# Patient Record
Sex: Female | Born: 1935
Health system: Southern US, Community
[De-identification: ages and names within clinical notes are randomized; demographics above are authoritative.]

## PROBLEM LIST (undated history)

## (undated) DIAGNOSIS — M4800 Spinal stenosis, site unspecified: Secondary | ICD-10-CM

## (undated) DIAGNOSIS — I10 Essential (primary) hypertension: Secondary | ICD-10-CM

---

## 1998-10-14 ENCOUNTER — Emergency Department (HOSPITAL_COMMUNITY): Admission: EM | Admit: 1998-10-14 | Discharge: 1998-10-14 | Payer: Self-pay | Admitting: Emergency Medicine

## 1998-10-14 ENCOUNTER — Encounter: Payer: Self-pay | Admitting: Emergency Medicine

## 1999-06-20 ENCOUNTER — Encounter: Payer: Self-pay | Admitting: Family Medicine

## 1999-06-20 ENCOUNTER — Encounter: Admission: RE | Admit: 1999-06-20 | Discharge: 1999-06-20 | Payer: Self-pay | Admitting: Family Medicine

## 2000-07-26 ENCOUNTER — Emergency Department (HOSPITAL_COMMUNITY): Admission: EM | Admit: 2000-07-26 | Discharge: 2000-07-26 | Payer: Self-pay

## 2000-07-29 ENCOUNTER — Ambulatory Visit (HOSPITAL_COMMUNITY): Admission: RE | Admit: 2000-07-29 | Discharge: 2000-07-29 | Payer: Self-pay

## 2000-10-09 ENCOUNTER — Encounter: Payer: Self-pay | Admitting: Family Medicine

## 2000-10-09 ENCOUNTER — Encounter: Admission: RE | Admit: 2000-10-09 | Discharge: 2000-10-09 | Payer: Self-pay | Admitting: Family Medicine

## 2001-03-11 ENCOUNTER — Encounter: Admission: RE | Admit: 2001-03-11 | Discharge: 2001-03-11 | Payer: Self-pay | Admitting: Obstetrics and Gynecology

## 2001-03-11 ENCOUNTER — Encounter: Payer: Self-pay | Admitting: Obstetrics and Gynecology

## 2002-06-01 ENCOUNTER — Other Ambulatory Visit: Admission: RE | Admit: 2002-06-01 | Discharge: 2002-06-01 | Payer: Self-pay

## 2003-02-07 ENCOUNTER — Encounter: Payer: Self-pay | Admitting: Family Medicine

## 2003-02-07 ENCOUNTER — Encounter: Admission: RE | Admit: 2003-02-07 | Discharge: 2003-02-07 | Payer: Self-pay | Admitting: Family Medicine

## 2003-09-08 ENCOUNTER — Observation Stay (HOSPITAL_COMMUNITY): Admission: RE | Admit: 2003-09-08 | Discharge: 2003-09-09 | Payer: Self-pay | Admitting: Orthopedic Surgery

## 2004-01-26 ENCOUNTER — Observation Stay (HOSPITAL_COMMUNITY): Admission: RE | Admit: 2004-01-26 | Discharge: 2004-01-27 | Payer: Self-pay | Admitting: Orthopedic Surgery

## 2004-02-16 ENCOUNTER — Encounter (INDEPENDENT_AMBULATORY_CARE_PROVIDER_SITE_OTHER): Payer: Self-pay | Admitting: *Deleted

## 2004-02-16 ENCOUNTER — Observation Stay (HOSPITAL_COMMUNITY): Admission: RE | Admit: 2004-02-16 | Discharge: 2004-02-18 | Payer: Self-pay | Admitting: Orthopedic Surgery

## 2004-09-20 ENCOUNTER — Encounter: Admission: RE | Admit: 2004-09-20 | Discharge: 2004-09-20 | Payer: Self-pay | Admitting: Otolaryngology

## 2004-11-21 ENCOUNTER — Ambulatory Visit (HOSPITAL_COMMUNITY): Admission: RE | Admit: 2004-11-21 | Discharge: 2004-11-21 | Payer: Self-pay | Admitting: Gastroenterology

## 2006-04-25 ENCOUNTER — Encounter: Admission: RE | Admit: 2006-04-25 | Discharge: 2006-04-25 | Payer: Self-pay | Admitting: Family Medicine

## 2006-12-13 ENCOUNTER — Emergency Department (HOSPITAL_COMMUNITY): Admission: EM | Admit: 2006-12-13 | Discharge: 2006-12-13 | Payer: Self-pay | Admitting: Emergency Medicine

## 2008-09-06 ENCOUNTER — Encounter: Admission: RE | Admit: 2008-09-06 | Discharge: 2008-09-06 | Payer: Self-pay | Admitting: Family Medicine

## 2009-02-25 ENCOUNTER — Emergency Department (HOSPITAL_COMMUNITY): Admission: EM | Admit: 2009-02-25 | Discharge: 2009-02-25 | Payer: Self-pay | Admitting: Emergency Medicine

## 2010-10-12 ENCOUNTER — Other Ambulatory Visit: Payer: Self-pay | Admitting: Gastroenterology

## 2010-10-13 ENCOUNTER — Inpatient Hospital Stay (HOSPITAL_COMMUNITY)
Admission: EM | Admit: 2010-10-13 | Discharge: 2010-10-17 | DRG: 378 | Disposition: A | Discharge status: Home / Self Care | Payer: Medicare Other | Attending: Internal Medicine | Admitting: Internal Medicine

## 2010-10-13 DIAGNOSIS — I10 Essential (primary) hypertension: Secondary | ICD-10-CM | POA: Diagnosis present

## 2010-10-13 DIAGNOSIS — D62 Acute posthemorrhagic anemia: Secondary | ICD-10-CM | POA: Diagnosis present

## 2010-10-13 DIAGNOSIS — R5383 Other fatigue: Secondary | ICD-10-CM | POA: Diagnosis present

## 2010-10-13 DIAGNOSIS — R5381 Other malaise: Secondary | ICD-10-CM | POA: Diagnosis present

## 2010-10-13 DIAGNOSIS — K5730 Diverticulosis of large intestine without perforation or abscess without bleeding: Secondary | ICD-10-CM | POA: Diagnosis present

## 2010-10-13 DIAGNOSIS — K5521 Angiodysplasia of colon with hemorrhage: Principal | ICD-10-CM | POA: Diagnosis present

## 2010-10-13 LAB — CROSSMATCH: ABO/RH(D): O NEG

## 2010-10-13 LAB — COMPREHENSIVE METABOLIC PANEL
ALT: 16 U/L (ref 0–35)
AST: 22 U/L (ref 0–37)
Albumin: 3.1 g/dL — ABNORMAL LOW (ref 3.5–5.2)
Alkaline Phosphatase: 36 U/L — ABNORMAL LOW (ref 39–117)
Potassium: 3.8 mEq/L (ref 3.5–5.1)
Sodium: 140 mEq/L (ref 135–145)
Total Protein: 5.8 g/dL — ABNORMAL LOW (ref 6.0–8.3)

## 2010-10-13 LAB — DIFFERENTIAL
Basophils Absolute: 0 10*3/uL (ref 0.0–0.1)
Lymphs Abs: 2.2 10*3/uL (ref 0.7–4.0)
Monocytes Relative: 5 % (ref 3–12)
Neutrophils Relative %: 67 % (ref 43–77)

## 2010-10-13 LAB — CBC
HCT: 19.5 % — ABNORMAL LOW (ref 36.0–46.0)
Hemoglobin: 6.3 g/dL — CL (ref 12.0–15.0)
MCH: 29.2 pg (ref 26.0–34.0)
MCHC: 32.3 g/dL (ref 30.0–36.0)
MCV: 90.3 fL (ref 78.0–100.0)

## 2010-10-13 LAB — OCCULT BLOOD, POC DEVICE: Fecal Occult Bld: POSITIVE

## 2010-10-13 LAB — ABO/RH: ABO/RH(D): O NEG

## 2010-10-14 LAB — BASIC METABOLIC PANEL
BUN: 16 mg/dL (ref 6–23)
CO2: 26 mEq/L (ref 19–32)
Calcium: 8.2 mg/dL — ABNORMAL LOW (ref 8.4–10.5)
Creatinine, Ser: 0.57 mg/dL (ref 0.4–1.2)
GFR calc non Af Amer: >60 mL/min (ref >60)
Glucose, Bld: 107 mg/dL — ABNORMAL HIGH (ref 70–99)
Sodium: 141 mEq/L (ref 135–145)

## 2010-10-14 LAB — HEMOGLOBIN AND HEMATOCRIT, BLOOD
HCT: 27.1 % — ABNORMAL LOW (ref 36.0–46.0)
HCT: 22.3 % — ABNORMAL LOW (ref 36.0–46.0)
HCT: 23.4 % — ABNORMAL LOW (ref 36.0–46.0)
Hemoglobin: 7.8 g/dL — ABNORMAL LOW (ref 12.0–15.0)

## 2010-10-15 LAB — BASIC METABOLIC PANEL
CO2: 25 mEq/L (ref 19–32)
Chloride: 111 mEq/L (ref 96–112)
Glucose, Bld: 107 mg/dL — ABNORMAL HIGH (ref 70–99)
Sodium: 140 mEq/L (ref 135–145)

## 2010-10-15 LAB — CBC
Hemoglobin: 7.8 g/dL — ABNORMAL LOW (ref 12.0–15.0)
MCH: 29.3 pg (ref 26.0–34.0)
MCHC: 32.8 g/dL (ref 30.0–36.0)
MCV: 89.5 fL (ref 78.0–100.0)
RBC: 2.66 MIL/uL — ABNORMAL LOW (ref 3.87–5.11)

## 2010-10-15 LAB — PROTIME-INR: Prothrombin Time: 14.7 seconds (ref 11.6–15.2)

## 2010-10-15 LAB — APTT: aPTT: 27 seconds (ref 24–37)

## 2010-10-15 LAB — HEMOGLOBIN AND HEMATOCRIT, BLOOD
HCT: 21.3 % — ABNORMAL LOW (ref 36.0–46.0)
HCT: 22.8 % — ABNORMAL LOW (ref 36.0–46.0)
Hemoglobin: 6.9 g/dL — CL (ref 12.0–15.0)

## 2010-10-15 LAB — PHOSPHORUS: Phosphorus: 3 mg/dL (ref 2.3–4.6)

## 2010-10-15 LAB — MAGNESIUM: Magnesium: 2.3 mg/dL (ref 1.5–2.5)

## 2010-10-15 NOTE — H&P (Signed)
[name], [name]               ACCOUNT NO.:  [id number]  MEDICAL RECORD NO.:  [id number]           PATIENT TYPE:  I  LOCATION:  1408                         FACILITY:  [facility]  PHYSICIAN:  Gordy Savers, MDDATE OF BIRTH:  1921-11-07  DATE OF ADMISSION:  10/13/2010 DATE OF DISCHARGE:                             HISTORY & PHYSICAL   CHIEF COMPLAINT:  Weakness.  HISTORY OF PRESENT ILLNESS:  The patient is an 77 year old white female who states that she has a remote history of peptic ulcer disease greater than 20 years ago.  At that time, she presented with pain but apparently no bleeding.  Five days ago, she had the onset of black melanotic stool. Three days ago, she was seen by her primary care provider and she was noted to be anemic at that time.  Apparently her H and H was normal 3 weeks prior at the time of her annual physical.  Yesterday, she underwent upper panendoscopy that apparently revealed no source of bleeding.  She was told to report to the emergency department if there was any clinical deterioration.  Over the past few days, she has had increasing weakness, dizziness, and especially palpitations.  Symptoms are worsened with activity.  Evaluation in the emergency department included a CBC that revealed a hemoglobin of 6.3 gm/dL and a hematocrit of 54.0%.  The patient is now admitted for further evaluation and treatment of her GI bleeding and mildly severe anemia.  PAST MEDICAL HISTORY:  Pertinent for a history of hypertension and osteoarthritis.  She has had a prior appendectomy and remote hysterectomy.  She underwent a colonoscopy in May of 2006 that revealed diverticular disease only.  She had 3 shoulder surgeries in 2005 for adhesive capsulitis complicated by infection.  FAMILY HISTORY:  Fairly noncontributory.  Father died at 59.  Mother died at 70 of natural causes.  She has had two brothers and three sisters, one brother died young in a motor vehicle  accident.  One sister died of diabetic complications.  SOCIAL HISTORY:  She was married for 57 years and is widowed.  She has 2 sons and 2 daughters.  One daughter died of MI at age 83.  She is a lifelong nondrinker, nonsmoker.  She lives independently and is quite active.  She states that she mowed her lawn 4 days ago.  DRUG ALLERGIES:  Include CODEINE.  MEDICATIONS:  Medical regimen:  Metoprolol 50 mg daily, oxybutynin 5 mg daily p.r.n., Advil she states she usually takes 2 every morning but she has held this medication for the past 2 days.  She takes multivitamins and calcium supplements.  REVIEW OF SYSTEMS:  Positive for progressive weakness, dizziness and palpitations.  No change in weight, although she states that she feels there has been increasing abdominal girth.  HEENT EXAM:  No visual disturbances or hearing loss.  PULMONARY:  Some dyspnea on exertion.  No history of pneumonia or pulmonary disease.  No history of tobacco use. CARDIAC:  No history of exertional chest pain.  No history of heart murmur.  GI:  See history of present illness.  GENITOURINARY:  Denies any  urinary frequency or dysuria.  She states that she takes oxybutynin for elevated blood pressure.  She is unaware of a diagnosis of overactive bladder.  ENDOCRINE:  No history of polyuria, polydipsia or thyroid disorder.  NEUROPSYCH:  No history of depression or any focal neurological symptoms.  PHYSICAL EXAMINATION:  VITAL SIGNS:  Temperature 98.2, pulse rate 81, respiratory rate 16, blood pressure 127/72, oxygen saturation 96%. GENERAL EXAM:  Revealed a well-developed healthy-appearing white female who appeared younger than her stated age.  She was alert and in no distress. SKIN:  Unremarkable except for paleness.  She did have a clean superficial abrasion over her left lateral lower leg. HEENT EXAM:  Revealed normal pupil responses.  Conjunctiva pale.  ENT otherwise unremarkable. NECK:  Supple.  No bruits  or neck vein distention. CHEST:  Clear. CARDIOVASCULAR EXAM:  Revealed a grade 2/6 systolic murmur loudest at the primary aortic area but heard diffusely. BREASTS:  Negative.  There was no resting tachycardia with the patient supine. ABDOMEN:  Obese, soft and nontender.  No organomegaly.  Right lower quadrant surgical scar noted. EXTREMITIES:  Revealed full pedal pulses.  No significant edema. NEURO EXAM:  Revealed the patient to be alert and oriented with normal affect.  Exam was nonfocal.  There was no motor weakness.  LABORATORY STUDIES:  Included an upper panendoscopy on October 12, 2010, that revealed no obvious bleeding site.  Hemoglobin and hematocrit of 6.3 gm/dL and hematocrit 16.1%.  MCV 90.  Chemistries revealed a glucose of 141, INR 1.0.  IMPRESSION: 1. Gastrointestinal bleeding. 2. Anemia.  ADDITIONAL DIAGNOSES: 1. History of hypertension. 2. Osteoarthritis. 3. Diverticulosis.  DISPOSITION:  The patient will be admitted to a telemetry setting. Blood has been typed and crossed and she will be transfused 2 units when available.  She has been given 500 cc of normal saline.  Her blood pressure and hemodynamic status is presently stable.  Serial H and H will be monitored carefully.  The patient will be considered for a colonoscopy Monday morning.  She will be placed on a clear liquid diet. She will be placed on empiric Protonix 40 mg IV.  Her ibuprofen will be discontinued.  She will be treated with sequential compression devices for DVT prophylaxis.     Gordy Savers, MD     PFK/MEDQ  D:  10/13/2010  T:  10/14/2010  Job:  [id number]  Electronically Signed by Eleonore Chiquito MD on 10/15/2010 10:03:52 AM

## 2010-10-16 LAB — CBC
HCT: 27.3 % — ABNORMAL LOW (ref 36.0–46.0)
Hemoglobin: 8.9 g/dL — ABNORMAL LOW (ref 12.0–15.0)
MCHC: 32.6 g/dL (ref 30.0–36.0)
RBC: 3.05 MIL/uL — ABNORMAL LOW (ref 3.87–5.11)

## 2010-10-16 LAB — BASIC METABOLIC PANEL
CO2: 28 mEq/L (ref 19–32)
Calcium: 8.1 mg/dL — ABNORMAL LOW (ref 8.4–10.5)
Chloride: 108 mEq/L (ref 96–112)
Glucose, Bld: 99 mg/dL (ref 70–99)
Sodium: 139 mEq/L (ref 135–145)

## 2010-10-17 LAB — TYPE AND SCREEN
Unit division: 0
Unit division: 0
Unit division: 0
Unit division: 0

## 2010-10-17 LAB — BASIC METABOLIC PANEL
BUN: 6 mg/dL (ref 6–23)
CO2: 27 mEq/L (ref 19–32)
Chloride: 108 mEq/L (ref 96–112)
Glucose, Bld: 111 mg/dL — ABNORMAL HIGH (ref 70–99)
Potassium: 3.7 mEq/L (ref 3.5–5.1)
Sodium: 139 mEq/L (ref 135–145)

## 2010-10-17 LAB — CBC
HCT: 28.6 % — ABNORMAL LOW (ref 36.0–46.0)
Hemoglobin: 9.3 g/dL — ABNORMAL LOW (ref 12.0–15.0)
MCV: 90.5 fL (ref 78.0–100.0)
RBC: 3.16 MIL/uL — ABNORMAL LOW (ref 3.87–5.11)
RDW: 15.4 % (ref 11.5–15.5)
WBC: 5.6 10*3/uL (ref 4.0–10.5)

## 2010-10-22 NOTE — Discharge Summary (Signed)
  NAMESHERECE, GAMBRILL               ACCOUNT NO.:  [id number]  MEDICAL RECORD NO.:  [id number]           PATIENT TYPE:  I  LOCATION:  1408                         FACILITY:  [facility]  PHYSICIAN:  Hollice Espy, M.D.DATE OF BIRTH:  04-01-1922  DATE OF ADMISSION:  10/13/2010 DATE OF DISCHARGE:  10/17/2010                              DISCHARGE SUMMARY   PRIMARY CARE PHYSICIAN:  L. Lupe Carney, M.D.  CONSULTING NURSE:  Graylin Shiver, M.D., [facility].  DISCHARGE DIAGNOSES: 1. Lower gastrointestinal tract bleed, thought to be secondary to     bleeding arteriovenous malformation. 2. 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 3. Acute blood loss anemia secondary to bleeding arteriovenous     malformation. 4. History of hypotension. 5. History of osteoarthritis. 6. History of incidental diverticulosis.  Discharge medications for this patient are as follows: 1. The patient normally is on Advil 400 p.o. daily.  She will not     restart this until she sees Dr. Clovis Riley.  She is being discharged     on artificial tears one drop both eyes as needed. 2. Os-Cal with D 1 tablet p.o. daily. 3. Coenzyme Q 60 mg p.o. daily. 4. Vitamin B12, 1000 mcg p.o. daily. 5. Fish oil 1000 mg p.o. daily. 6. Metoprolol 50 mg p.o. daily.  HOSPITAL COURSE:  The patient is an 77 year old white female with a past medical history of hypertension and osteoarthritis, who presented on September 15, 2010, because of fatigue, weakness, palpations, found to have a hemoglobin of 6.3.  She had previously seen Dr. Dulce Sellar 1 week prior for a history of melenic stools and anemia.  He had done an EGD, which was unremarkable.  __________  colonoscopy, however, because of her symptoms she came to the emergency room.  She was noted to have dark black stools, now for a few weeks intermittently.  She was admitted by the hospitalist.  She was given several units of packed red blood cells. [facility] was consulted, performed a colonoscopy and she was found to have several small AVMs in the cecum, which were cauterized with an argon plasma coagulator.  She also noted to have some incidental diverticulosis.  Postprocedure, she was stable.  Her hemoglobin on the morning at 05:27, was noted to be 8.9, by evening was 9.5 and by morning of discharge is 9.3.  __________ Her blood pressure has been stable at 119/64 with a heart rate 61 after her beta-blocker.  She is otherwise looking well.  She is tolerating p.o.  Our plan will be to discharge the patient to home.  She will follow up with Dr. Clovis Riley in the next 2 weeks.  DISPOSITION:  Improved.  ACTIVITY:  Slowly increase.  DISCHARGE DIET:  Low-sodium diet.     Hollice Espy, M.D.     SKK/MEDQ  D:  10/17/2010  T:  10/18/2010  Job:  [id number]  cc:   Graylin Shiver, M.D. Fax: [phone]  L. Lupe Carney, M.D. Fax: [phone]  Electronically Signed by Virginia Rochester M.D. on 10/22/2010 02:37:30 PM

## 2010-10-23 NOTE — Consult Note (Signed)
  NAMETAHJ, LINDSETH NO.:  [id number]  MEDICAL RECORD NO.:  [id number]           PATIENT TYPE:  I  LOCATION:  1408                         FACILITY:  [facility]  PHYSICIAN:  Graylin Shiver, M.D.   DATE OF BIRTH:  1922-03-02  DATE OF CONSULTATION:  10/14/2010 DATE OF DISCHARGE:                                CONSULTATION   REASON FOR CONSULTATION:  The patient is an 77 year old female who came to the emergency room yesterday at [facility] because of complaints of fatigue, weakness, and heart palpitations.  She was found to have a hemoglobin of 6.3 and a hematocrit of 19.5.  The patient had recently seen Dr. Dulce Sellar last week because of a history of melanic stools and anemia and an EGD done last week was unrevealing for a source of this problem.  He was planning to do a colonoscopy on Monday as an outpatient; however, because of her symptoms, she came to the emergency room and was admitted.  The patient states that she has seen dark black- colored stools for a few weeks now intermittently.  PAST MEDICAL HISTORY: 1. Hypertension. 2. Osteoarthritis. 3. History of diverticulosis, last colonoscopy was in 2006.  PAST SURGICAL HISTORY:  Appendectomy, hysterectomy, and various orthopedic surgeries.  FAMILY HISTORY:  Positive for colon cancer in her father.  ALLERGIES: 1. CODEINE. 2. AMLODIPINE BESYLATE.  PHYSICAL EXAMINATION:  GENERAL:  She does not appear in any acute distress at this time, nonicteric. HEART:  Regular rhythm.  No murmurs. LUNGS:  Clear. ABDOMEN:  Soft.  Nontender.  No hepatosplenomegaly.  IMPRESSION: 1. Anemia. 2. Melena with heme-positive stool, documented here in the hospital.  PLAN:  We will plan to proceed with colonoscopy tomorrow to further investigate.          ______________________________ Graylin Shiver, M.D.     SFG/MEDQ  D:  10/14/2010  T:  10/14/2010  Job:  [id number]  cc:   Willis Modena, MD Fax:  [phone]  Llana Aliment. Malon Kindle., M.D. Fax: [phone]  Electronically Signed by Herbert Moors MD on 10/23/2010 04:38:38 PM

## 2010-10-23 NOTE — Op Note (Signed)
  [name], [name]               ACCOUNT NO.:  [id number]  MEDICAL RECORD NO.:  [id number]           PATIENT TYPE:  I  LOCATION:  1408                         FACILITY:  [facility]  PHYSICIAN:  Graylin Shiver, M.D.   DATE OF BIRTH:  1922-02-11  DATE OF PROCEDURE:  10/15/2010 DATE OF DISCHARGE:                              OPERATIVE REPORT   PROCEDURE:  Colonoscopy with argon plasma coagulation.  INDICATION FOR PROCEDURE:  Ongoing melena despite normal esophagogastroduodenoscopy and associated heme-positive stool and anemia.  Informed consent was obtained after explanation of the risks of bleeding, infection, and perforation.  PREMEDICATIONS:  Fentanyl 60 mcg IV, Versed 6 mg IV.  PROCEDURE IN DETAIL:  With the patient in the left lateral decubitus position, a rectal exam was performed and no masses were felt.  The Pentax colonoscope was inserted into the rectum and advanced around the colon to the cecum.  Cecal landmarks were identified.  In the cecum, there were several small-to-medium-sized arteriovenous malformations.  I washed these areas and one of these areas did seem to ooze a little bit with washing.  It was felt that in view of her ongoing melena and negative EGD and significant anemia, that these were probably the source for her problem.  It was therefore indicated to proceed with argon plasma coagulation.  These several AVMs were coagulated with the argon plasma coagulator with good results.  There was some oozing noted after a couple of bursts of coagulation to a couple of these areas that we stopped with further cautery and washing and watching the area.  The rest of the colon looked normal except for some diverticulosis in the left colon.  She tolerated the procedure well without complications.  IMPRESSION:  Several small arteriovenous malformations in the cecum, which were cauterized with the argon plasma coagulator and diverticulosis of the left colon.  PLAN:   Observe response to coagulation.  This may need to be done again should bleeding persists.          ______________________________ Graylin Shiver, M.D.     SFG/MEDQ  D:  10/15/2010  T:  10/16/2010  Job:  [id number]  cc:   Willis Modena, MD Fax: [phone]  Electronically Signed by Herbert Moors MD on 10/23/2010 04:38:40 PM

## 2010-12-07 NOTE — Op Note (Signed)
[name], [name]                         ACCOUNT NO.:  [id number]   MEDICAL RECORD NO.:  [id number]                   PATIENT TYPE:  AMB   LOCATION:  DAY                                  [facility]ACILITY:  [facility]   PHYSICIAN:  Vania Rea. Supple, M.D.               DATE OF BIRTH:  08-Jan-1922   DATE OF PROCEDURE:  01/26/2004  DATE OF DISCHARGE:                                 OPERATIVE REPORT   PREOPERATIVE DIAGNOSIS:  Recurrent right shoulder rotator cuff tear.   POSTOPERATIVE DIAGNOSIS:  1. Recurrent right shoulder rotator cuff tear.  2. Right shoulder adhesive capsulitis.   PROCEDURES:  1. Repair of recurrent right shoulder rotator cuff tear with restore patch     augmentation.  2. Right shoulder manipulation under anesthesia.   SURGEON OF RECORD:  Vania Rea. Supple, M.D.   ASSISTANT:  French Ana Shuford, P.A.C.   ANESTHESIA:  General endotracheal as well as __________ interscalene block.   ESTIMATED BLOOD LOSS:  250 mL.   DRAINS:  None.   BRIEF HISTORY:  [name] is an 77 year old female who underwent a previous  right shoulder rotator cuff repair for a large retracted rotator cuff tear  earlier this year.  She had initially done very well but then had an abrupt  increase in right shoulder pain following physical therapy session.  Her  examination has subsequently showed persistent weakness and limitation of  motion with severe pain.  Subsequent MRI scan confirms a large, recurrent  tear of the rotator cuff and also evidence for fatty infiltration of the  supra and infraspinatus.  At this point considering [name]' significant  pain and functional limitations, she is brought to the operating room at  this time for an attempt at repeat repair of the rotator cuff.   Preoperatively discussed with [name] the treatment options as well as  risks vs. benefits thereof.  The possible surgical complications of  bleeding, infection, neurovascular injury, persistent pain, loss of  motion,  recurrence of rotator cuff tear, anesthetic complication and possible need  for additional surgery are reviewed.  She understands and accepts and agrees  with our planned procedure.   PROCEDURE IN DETAIL:  After undergoing routine preoperative evaluation, the  patient received prophylactic antibiotics and had an interscalene block  established in the holding area with anesthesia, no problem.  Placed supine  on the operating table and underwent the induction of general endotracheal  anesthesia.  Placed into the beach chair position with the right shoulder  region serially prepped and draped in standard fashion.  Through the  previous right shoulder incision we reopened the incision a total length of  approximately 8 cm beginning at the acromioclavicular interval extending  laterally and distally.  Skin flaps were mobilized and electrocautery was  used for hemostasis.  The previous split in the deltoid was then reopened to  the junction between the anterior and mid thirds.  Previous distal clavicle  resection and subacromial decompression allowed proper visualization.  There  were noted to be multiple adhesions in the subdeltoid subacromial bursa.  In  addition, there was obvious complete retracted tear of the rotator cuff,  with the subscapularis and supraspinatus having been retracted medially and  anteriorly and the entire humeral head being exposed and abutting the  undersurface of the acromion.  The infraspinatus was not visualized and  appeared to have been completely degenerated.  The upper margin on the teres  minor was visualized.  We performed an extensive bursectomy and lysis of  adhesions and freed up the rotator cuff superiorly and inferiorly over the  glenoid to allow reasonable elasticity of the cuff tissues.  The biceps  tendon was intact and this was preserved.  The glenohumeral joint was  inspected and irrigated of debris.  The rotator cuff margins were then   mobilized to allow the supraspinatus and subscapularis to be brought  superiorly and posteriorly to allow a side-to-side repair of the interval  between the teres minor and the anterior cuff tissues.  A series of figure-  of-eight #2 5-0 wire sutures were placed.  As the tissues were being  tensioned, it was clear that the shoulder was quite stiff and at this point  we proceeded with a manipulation and this allowed release of multiple  adhesions and contractures in the axillary pouch and once this was completed  then the humeral head sat symmetrically within the glenoid and was no longer  high riding.  Once this had been completed, then the rotator cuff interval  nicely reapproximated.  Two separate Arthrex Bio-Cork screw suture anchors  were then placed in the bony trough on the greater tuberosity.  The bony  trough was redebrided with a rongeur to a bony bed.  The previously placed  drill holes through the greater tuberosity appeared to be intact but there  were multiple sutures visible in the anterior margin rotator cuff.  It is  unclear whether the retained sutures that we identified were related to  disruption of the side-to-side repair, which we had previously performed, or  if the sutures had pulled out through the bone on the greater tuberosity.  Regardless, the retained sutures were all removed.  The suture anchors were  then placed, one was a 5.0 and the second being a 6.5.  Good bony purchase  was achieved.  The free limbs of the suture were then placed through the  adjacent aspect of the anterior rotator cuff leaflet and then a separate set  of limbs was passed through the teres minor posteriorly.  These sutures were  all then sequentially tied after final irrigation had been performed.  This  allowed good reapproximation of the anterior and posterior aspects of the  rotator cuff and then good reapproximation of the cuff tissue over the bony bed on the greater tuberosity.  There  was a residual small deficiency  rotator cuff tissue overlying the most central aspect of the repair site at  the greater tuberosity.  With this in mind, we opened up a restore patch,  moistened it.  When it was appropriately rehydrated, the restore patch was  then sutured into position over the rotator cuff repair using #2 Vicryl  suture.  Once the restore patch was sutured into position with proper  tension, the margins were then trimmed.  The shoulder was taken through a  range of motion and there was no evidence for excessive tension with  the arm  at the side.  Final irrigation and hemostasis was performed.  The deltoid  split was closed with a series of figure-of-eight #1 Ethibond sutures.  The  2-0 Vicryl used for the subcu and intracuticular 3-0 Monocryl for the skin  followed by Steri-Strips.  A bulky pressure dressing was applied.  A DonJoy  UltraSling was then applied with a small abduction pillow.  The patient was  then placed supine, extubated and taken to the recovery room in stable  condition.                                               Vania Rea. Supple, M.D.    KMS/MEDQ  D:  01/26/2004  T:  01/26/2004  Job:  [id number]

## 2010-12-07 NOTE — Op Note (Signed)
Jocelyn [name], [name]                         ACCOUNT NO.:  [id number]   MEDICAL RECORD NO.:  [id number]                   PATIENT TYPE:  AMB   LOCATION:  DAY                                  FACILITY:  [facility]   PHYSICIAN:  Vania Rea. Supple, M.D.               DATE OF BIRTH:  02-19-1922   DATE OF PROCEDURE:  09/08/2003  DATE OF DISCHARGE:                                 OPERATIVE REPORT   PREOPERATIVE DIAGNOSES:  1. Right shoulder rotator cuff tear.  2. Right shoulder chronic impingement syndrome.  3. Right shoulder acromioclavicular joint arthrosis.   POSTOPERATIVE DIAGNOSES:  1. Right shoulder rotator cuff tear.  2. Right shoulder chronic impingement syndrome.  3. Right shoulder acromioclavicular joint arthrosis.   OPERATION/PROCEDURE:  1. Open right shoulder rotator cuff repair.  2. Right shoulder subacromial depression.  3. Right shoulder distal resection.   SURGEON:  Vania Rea. Supple, M.D.   Threasa HeadsFrench Ana A. Shuford, P.A.-C.   ANESTHESIA:  LMA, general.   ESTIMATED BLOOD LOSS:  200 mL.   HISTORY:  Jocelyn [name] is an 77 year old female who has had chronic right  shoulder pain with limitations in motion with examination and showing  weakness to testing rotator cuff and severely positive impingement sign.  Plain radiographs confirm AC joint arthrosis with preoperative MRI scan  confirming a large retracted tear of the rotator cuff.  Due to ongoing pain  and functional limitations, she is brought to the operating room for a  planned right shoulder rotator repair as described below.   Preoperatively, Jocelyn [name] was counseled  options as well as risks versus  benefits.  Possible surgical complications of bleeding, infection,  neurovascular injury, persistent pain, loss of motion, recurrence of rotator  cuff tear, anesthesia complications, possible need for additional surgery  were reviewed.  She understands and accepts and agrees to the planned  procedure.   DESCRIPTION OF PROCEDURE:  After undergoing preoperative evaluation, the  patient was given prophylactic antibiotics.  She had an interscalene block  established in the holding area by the anesthesia department.  She was  placed supine on the operating table and underwent a smooth induction of an  LMA general anesthesia.  She was placed in the beach chair position and  appropriately padded and protected.  The right shoulder region was sterilely  prepped and draped in the standard fashion.   An incision was outlined beginning at the [facility] joint and extending laterally  and distally for a length of approximately 6 cm.  The proposed incision was  infiltrated with 8 mL of 0.5% Marcaine with epinephrine.  The skin and  subcutaneous tissues were then sharply divided.  The deltoid was then split  along the line of the skin incision with electrocautery.  Subperiosteal  elevation was then used to expose the distal clavicle, the [facility] joint and the  anterior acromion.  The ligament was divided.  Distal clavicle  was then  exposed and the surrounding soft tissues were appropriately protected and an  oscillating saw was then used to perform distal palpable resection removing  appropriately 1 cm of the distal clavicle.  The anterior acromion was then  appropriately exposed.  Using the oscillating saw, we performed an anterior  inferior acromionectomy with _________ morphology.  We performed an  extensive subacromial bursectomy, removing large amount of scarified tissue,  as well as bursal tissue, and completely freeing up adhesions between the  undersurface of the deltoid and the adjacent rotator cuff.  We did identify  an obvious large defect and rotator cuff involving the entire supraspinatus  and the anterior portion of the infraspinatus.  The biceps tendon was  identified and found to be in good condition and was carefully protected and  remained intact.  The free margin of the rotator cuff was then  trimmed with  15-blade knife back to healthy-appearing tissue.  The cuff was then  mobilized in both superior and inferior aspects.  The rotator cuff had a L  shaped tear.  We placed two-sided, side reapproximating sutures to close  down the arm of the L tear.  We then placed a series of three modified  Kessler grasping sutures in the remaining free margin rotator cuff.  All of  these were performed with #2 fiber wire sutures.  An osteotome was then used  to create a cancellous _________, measuring approximately 3 cm in width.  Rongeur was then used to smooth the contour, remove residual bony debris and  soft tissue.  At this point the joint was copiously irrigated.  The  glenohumeral articular surfaces being in relatively good condition.  The  Concept tenaculum was then used to make two bone tunnels and suture limbs  were then brought down through the bone tunnels and then tied sequentially  from anterior to posterior, allowing excellent reapposition of the premargin  rotator cuff down to the bony bed on the greater tuberosity.  We then placed  an additional figure-of-eight suture to further reinforce the repair at the  level of the greater tuberosity.  The arm was then taken through a range of  motion showing good stability and repair and no evidence for excessive  tension when the arm is at the side.  Final irrigation was performed.  Hemostasis was obtained.  We used two #1 Ethibond sutures to repair the  anterior margin of the deltoid to the anterior acromion through bone  tunnels.  The remaining deltoid was then closed with interrupted figure-of-  eight #1 Vicryl sutures, 2-0 Vicryl was used for the subcutaneous layer and  intracuticular 3-0 Monocryl used for the skin followed by Steri-Strips.  Bulky dry dressing was placed on the shoulder and the arm was placed in  shoulder immobilizer.  The patient was once again placed supine, extubated and taken to the recovery room in stable  condition.                                               Vania Rea. Supple, M.D.    KMS/MEDQ  D:  09/08/2003  T:  09/08/2003  Job:  [id number]

## 2010-12-07 NOTE — Op Note (Signed)
[name], [name]               ACCOUNT NO.:  [id number]   MEDICAL RECORD NO.:  [id number]          PATIENT TYPE:  AMB   LOCATION:  ENDO                         FACILITY:  [facility]   PHYSICIAN:  James L. Malon Kindle., M.D.DATE OF BIRTH:  1922/03/13   DATE OF PROCEDURE:  11/21/2004  DATE OF DISCHARGE:                                 OPERATIVE REPORT   PROCEDURE:  Colonoscopy.   MEDICATIONS:  Fentanyl 40 mcg, Versed 3.5 mg IV.   SCOPE:  Olympus pediatric adjustable colonoscope.   INDICATIONS:  Screening colonoscopy.   DESCRIPTION OF PROCEDURE:  The procedure was explained to the patient and  consent obtained.  With the patient in the left lateral decubitus position,  the Olympus scope was inserted and advanced.  The prep was quite good.  The  patient had diverticulosis.  We were eventually able to pass the area of  diverticulosis and get over to the right colon using abdominal pressure,  position changes.  With the patient in the right lateral decubitus position,  we were able to advance down to the ascending colon.  I could see the  ileocecal valve and the appendiceal orifice and cecum in the distance but  could not advance completely down into it.  The patient had a very atonic,  large abdomen.  The scope was withdrawn and again, the cecum was seen in the  distance and appeared normal.  No gross lesions.  The ascending colon,  transverse colon, splenic flexure, descending and sigmoid colon were seen  well.  No polyps were seen.  Moderate diverticulosis in the sigmoid colon.  The scope was withdrawn.  The patient tolerated the procedure well, was  maintained on low flow oxygen, pulse oximetry throughout the procedure.   ASSESSMENT:  1.  Normal screening colonoscopy.  V76.51.  2.  Diverticulosis.  562.10.   PLAN:  Would recommend yearly Hemoccult's and repeat again only for specific  indications such as positive stool, etc.      JLE/MEDQ  D:  11/21/2004  T:  11/21/2004  Job:   [id number]   cc:   Foye Clock  [address]  [place]  [place] [address]  Fax: [phone]

## 2010-12-07 NOTE — Op Note (Signed)
[name], [name]                         ACCOUNT NO.:  [id number]   MEDICAL RECORD NO.:  [id number]                   PATIENT TYPE:  INP   LOCATION:  0004                                 FACILITY:  [facility]   PHYSICIAN:  Vania Rea. Supple, M.D.               DATE OF BIRTH:  1922-03-16   DATE OF PROCEDURE:  02/16/2004  DATE OF DISCHARGE:                                 OPERATIVE REPORT   PREOPERATIVE DIAGNOSIS:  Right shoulder postoperative drainage with possible  infection versus sterile inflammatory process related to reaction to the  Restore patch utilized for augmentation of rotator cuff repair.   POSTOPERATIVE DIAGNOSES:  1. Right shoulder postoperative drainage with possible infection versus     sterile inflammatory process related to reaction to the Restore patch     utilized for augmentation of rotator cuff repair.  2. Recurrent right rotator cuff tear.   PROCEDURES:  Right shoulder exploration, irrigation, and debridement.   SURGEON OF RECORD:  Vania Rea. Supple, M.D.   Threasa HeadsFrench Ana A. Shuford, P.A.-C.   ANESTHESIA:  General endotracheal.   ESTIMATED BLOOD LOSS:  150 mL.   SPECIMENS:  Fluid was sent for routine culture and sensitivity as well as a  Gram stain.  Intraoperative Gram stain did come back with moderate wbc's but  no organisms seen.  The tissue from the subacromial space including several  clumps of fatty-appearing tissue as well as rotator cuff tissue were sent  for routine pathology.   DRAINS:  Hemovac x1.   HISTORY:  [name] is an 77 year old female who is three weeks out from a  repair of a recurrent right shoulder rotator cuff tear which had been  augmented with a Restore patch.  She did well initially but on return to the  office this past Monday, three days ago, she had noticed some increased pain  as well as serous drainage from the proximal aspect of her wound.  At that  time the wound had been opened up and approximately 25 mL of serous  fluid  with several large fatty clumps which had the appearance of fat necrosis  were evacuated.  A small wick drain was placed and we started her on oral  Keflex.  Subsequent evaluation in the office showed that the wound had shown  further deterioration with some early erythema as well as persistent  drainage and increased pain.  She is subsequently brought to the operating  room today for planned formal I&D.   Preoperatively discussed with [name] treatment options as well as risks  versus benefits thereof.  Possible complications of bleeding, persistent  infection, anesthetic complications, and possible __________.  She  understands and accepts and agrees with our planned procedure.   PROCEDURE IN DETAIL:  After undergoing routine preoperative evaluation, the  patient received prophylactic antibiotics.  Placed supine on the operating  table and underwent smooth induction of general endotracheal  anesthesia.  Of  note, she is a difficult intubation but this was performed today very  smoothly by Dr. Shireen Quan.   She was then placed into the beach chair position and appropriately padded  and protected.  The right shoulder girdle region was sterilely prepped and  draped.  The previous surgical incision was then reopened sharply and  copious amounts of serous fluid with multiple fatty-consistency globules  were evacuated.  We did obtain immediate cultures.  The deltoid split was  then reopened through the previous dissection interval and on inspection of  the subacromial space, there was an obvious complete rupture of the rotator  cuff.  The suture limbs had pulled out of the rotator cuff margin and the  suture anchors remained within the humeral head.  All residual Fibrewire and  Ethibond that had been used for the repair was removed.  The posterior  aspect of the rotator cuff had completely disintegrated, and the majority of  the anterior rotator cuff involving the residual  supraspinatus had also  completely deteriorated.  The rotator cuff was trimmed back to healthy-  appearing tissue with a rongeur.  There was really no viable rotator cuff  muscle or tendon left involving the supraspinatus and infraspinatus or the  teres minor.  The subscapularis did appear to be intact.  The biceps tendon  was identified and this showed some significant fraying, but we elected to  leave the biceps intact to help assist as a humeral head depressor.  The  glenohumeral surfaces were in good condition.  At this time pulsatile lavage  was then brought in.  We did bluntly dissect the subdeltoid bursal region  and manipulate the shoulder and obtained improved passive range of motion.  The pulsatile lavage was then used to meticulously clean the rotator cuff  margin of the glenohumeral joint and the subacromial space.  Final  hemostasis was obtained.  The deltoid split was then repaired with #1  Ethibond through bone tunnels in the acromion, followed by figure-of-eight  #1 Ethibond for the remaining deltoid split.  Vicryl 2-0 was used for the  subcutaneous layer and vertical mattress sutures of 3-0 nylon were used to  close the skin.  Adaptic and dry dressing was taped over the right shoulder.  The right arm was placed in a sling.  The patient was then placed supine and  extubated and taken to the recovery room in stable condition.                                               Vania Rea. Supple, M.D.    KMS/MEDQ  D:  02/16/2004  T:  02/16/2004  Job:  [id number]

## 2011-10-01 DIAGNOSIS — R143 Flatulence: Secondary | ICD-10-CM | POA: Diagnosis not present

## 2011-10-01 DIAGNOSIS — Z79899 Other long term (current) drug therapy: Secondary | ICD-10-CM | POA: Diagnosis not present

## 2011-10-01 DIAGNOSIS — I10 Essential (primary) hypertension: Secondary | ICD-10-CM | POA: Diagnosis not present

## 2011-10-01 DIAGNOSIS — K5520 Angiodysplasia of colon without hemorrhage: Secondary | ICD-10-CM | POA: Diagnosis not present

## 2011-10-01 DIAGNOSIS — R141 Gas pain: Secondary | ICD-10-CM | POA: Diagnosis not present

## 2011-10-01 DIAGNOSIS — N318 Other neuromuscular dysfunction of bladder: Secondary | ICD-10-CM | POA: Diagnosis not present

## 2011-10-09 DIAGNOSIS — B9789 Other viral agents as the cause of diseases classified elsewhere: Secondary | ICD-10-CM | POA: Diagnosis not present

## 2012-01-08 DIAGNOSIS — H4300 Vitreous prolapse, unspecified eye: Secondary | ICD-10-CM | POA: Diagnosis not present

## 2012-01-08 DIAGNOSIS — H35379 Puckering of macula, unspecified eye: Secondary | ICD-10-CM | POA: Diagnosis not present

## 2012-01-08 DIAGNOSIS — Z961 Presence of intraocular lens: Secondary | ICD-10-CM | POA: Diagnosis not present

## 2012-01-08 DIAGNOSIS — H43399 Other vitreous opacities, unspecified eye: Secondary | ICD-10-CM | POA: Diagnosis not present

## 2012-01-08 DIAGNOSIS — H353190 Nonexudative age-related macular degeneration, unspecified eye, stage unspecified: Secondary | ICD-10-CM | POA: Diagnosis not present

## 2012-04-03 DIAGNOSIS — Z79899 Other long term (current) drug therapy: Secondary | ICD-10-CM | POA: Diagnosis not present

## 2012-04-03 DIAGNOSIS — N318 Other neuromuscular dysfunction of bladder: Secondary | ICD-10-CM | POA: Diagnosis not present

## 2012-04-03 DIAGNOSIS — I10 Essential (primary) hypertension: Secondary | ICD-10-CM | POA: Diagnosis not present

## 2012-04-03 DIAGNOSIS — Z23 Encounter for immunization: Secondary | ICD-10-CM | POA: Diagnosis not present

## 2012-04-03 DIAGNOSIS — K5520 Angiodysplasia of colon without hemorrhage: Secondary | ICD-10-CM | POA: Diagnosis not present

## 2012-06-23 DIAGNOSIS — J019 Acute sinusitis, unspecified: Secondary | ICD-10-CM | POA: Diagnosis not present

## 2012-11-23 DIAGNOSIS — I10 Essential (primary) hypertension: Secondary | ICD-10-CM | POA: Diagnosis not present

## 2012-11-23 DIAGNOSIS — Z23 Encounter for immunization: Secondary | ICD-10-CM | POA: Diagnosis not present

## 2012-11-23 DIAGNOSIS — N318 Other neuromuscular dysfunction of bladder: Secondary | ICD-10-CM | POA: Diagnosis not present

## 2012-11-23 DIAGNOSIS — N951 Menopausal and female climacteric states: Secondary | ICD-10-CM | POA: Diagnosis not present

## 2012-11-23 DIAGNOSIS — R609 Edema, unspecified: Secondary | ICD-10-CM | POA: Diagnosis not present

## 2012-11-23 DIAGNOSIS — Z0000 Encounter for general adult medical examination without abnormal findings: Secondary | ICD-10-CM | POA: Diagnosis not present

## 2012-12-30 DIAGNOSIS — N951 Menopausal and female climacteric states: Secondary | ICD-10-CM | POA: Diagnosis not present

## 2013-05-27 DIAGNOSIS — N318 Other neuromuscular dysfunction of bladder: Secondary | ICD-10-CM | POA: Diagnosis not present

## 2013-05-27 DIAGNOSIS — I10 Essential (primary) hypertension: Secondary | ICD-10-CM | POA: Diagnosis not present

## 2013-05-27 DIAGNOSIS — Z79899 Other long term (current) drug therapy: Secondary | ICD-10-CM | POA: Diagnosis not present

## 2013-05-27 DIAGNOSIS — J019 Acute sinusitis, unspecified: Secondary | ICD-10-CM | POA: Diagnosis not present

## 2013-05-27 DIAGNOSIS — Z23 Encounter for immunization: Secondary | ICD-10-CM | POA: Diagnosis not present

## 2013-11-26 DIAGNOSIS — N318 Other neuromuscular dysfunction of bladder: Secondary | ICD-10-CM | POA: Diagnosis not present

## 2013-11-26 DIAGNOSIS — M159 Polyosteoarthritis, unspecified: Secondary | ICD-10-CM | POA: Diagnosis not present

## 2013-11-26 DIAGNOSIS — Z79899 Other long term (current) drug therapy: Secondary | ICD-10-CM | POA: Diagnosis not present

## 2013-11-26 DIAGNOSIS — Z23 Encounter for immunization: Secondary | ICD-10-CM | POA: Diagnosis not present

## 2013-11-26 DIAGNOSIS — I10 Essential (primary) hypertension: Secondary | ICD-10-CM | POA: Diagnosis not present

## 2014-06-15 DIAGNOSIS — I10 Essential (primary) hypertension: Secondary | ICD-10-CM | POA: Diagnosis not present

## 2014-06-15 DIAGNOSIS — J069 Acute upper respiratory infection, unspecified: Secondary | ICD-10-CM | POA: Diagnosis not present

## 2014-06-22 DIAGNOSIS — Z23 Encounter for immunization: Secondary | ICD-10-CM | POA: Diagnosis not present

## 2014-06-22 DIAGNOSIS — J069 Acute upper respiratory infection, unspecified: Secondary | ICD-10-CM | POA: Diagnosis not present

## 2014-06-22 DIAGNOSIS — M150 Primary generalized (osteo)arthritis: Secondary | ICD-10-CM | POA: Diagnosis not present

## 2014-06-22 DIAGNOSIS — N3281 Overactive bladder: Secondary | ICD-10-CM | POA: Diagnosis not present

## 2014-06-22 DIAGNOSIS — Z0000 Encounter for general adult medical examination without abnormal findings: Secondary | ICD-10-CM | POA: Diagnosis not present

## 2014-06-22 DIAGNOSIS — I10 Essential (primary) hypertension: Secondary | ICD-10-CM | POA: Diagnosis not present

## 2014-12-23 DIAGNOSIS — I10 Essential (primary) hypertension: Secondary | ICD-10-CM | POA: Diagnosis not present

## 2014-12-23 DIAGNOSIS — N3281 Overactive bladder: Secondary | ICD-10-CM | POA: Diagnosis not present

## 2014-12-23 DIAGNOSIS — M150 Primary generalized (osteo)arthritis: Secondary | ICD-10-CM | POA: Diagnosis not present

## 2015-06-23 DIAGNOSIS — N3281 Overactive bladder: Secondary | ICD-10-CM | POA: Diagnosis not present

## 2015-06-23 DIAGNOSIS — Z23 Encounter for immunization: Secondary | ICD-10-CM | POA: Diagnosis not present

## 2015-06-23 DIAGNOSIS — M150 Primary generalized (osteo)arthritis: Secondary | ICD-10-CM | POA: Diagnosis not present

## 2015-06-23 DIAGNOSIS — I10 Essential (primary) hypertension: Secondary | ICD-10-CM | POA: Diagnosis not present

## 2015-07-25 DIAGNOSIS — B349 Viral infection, unspecified: Secondary | ICD-10-CM | POA: Diagnosis not present

## 2015-08-10 DIAGNOSIS — S80811A Abrasion, right lower leg, initial encounter: Secondary | ICD-10-CM | POA: Diagnosis not present

## 2015-08-10 DIAGNOSIS — T148 Other injury of unspecified body region: Secondary | ICD-10-CM | POA: Diagnosis not present

## 2015-11-01 DIAGNOSIS — J069 Acute upper respiratory infection, unspecified: Secondary | ICD-10-CM | POA: Diagnosis not present

## 2016-01-10 DIAGNOSIS — M150 Primary generalized (osteo)arthritis: Secondary | ICD-10-CM | POA: Diagnosis not present

## 2016-01-10 DIAGNOSIS — I10 Essential (primary) hypertension: Secondary | ICD-10-CM | POA: Diagnosis not present

## 2016-01-10 DIAGNOSIS — N3281 Overactive bladder: Secondary | ICD-10-CM | POA: Diagnosis not present

## 2016-08-02 DIAGNOSIS — I10 Essential (primary) hypertension: Secondary | ICD-10-CM | POA: Diagnosis not present

## 2016-08-02 DIAGNOSIS — E669 Obesity, unspecified: Secondary | ICD-10-CM | POA: Diagnosis not present

## 2016-08-02 DIAGNOSIS — Z23 Encounter for immunization: Secondary | ICD-10-CM | POA: Diagnosis not present

## 2016-08-02 DIAGNOSIS — M150 Primary generalized (osteo)arthritis: Secondary | ICD-10-CM | POA: Diagnosis not present

## 2016-08-02 DIAGNOSIS — N3281 Overactive bladder: Secondary | ICD-10-CM | POA: Diagnosis not present

## 2016-08-02 DIAGNOSIS — J309 Allergic rhinitis, unspecified: Secondary | ICD-10-CM | POA: Diagnosis not present

## 2017-02-03 DIAGNOSIS — I10 Essential (primary) hypertension: Secondary | ICD-10-CM | POA: Diagnosis not present

## 2017-02-03 DIAGNOSIS — N3281 Overactive bladder: Secondary | ICD-10-CM | POA: Diagnosis not present

## 2017-02-03 DIAGNOSIS — M150 Primary generalized (osteo)arthritis: Secondary | ICD-10-CM | POA: Diagnosis not present

## 2017-03-25 ENCOUNTER — Ambulatory Visit
Admission: RE | Admit: 2017-03-25 | Discharge: 2017-03-25 | Disposition: A | Discharge status: Home / Self Care | Payer: Medicare Other | Source: Ambulatory Visit | Attending: Family Medicine | Admitting: Family Medicine

## 2017-03-25 ENCOUNTER — Other Ambulatory Visit: Payer: Self-pay | Admitting: Family Medicine

## 2017-03-25 DIAGNOSIS — M545 Low back pain: Secondary | ICD-10-CM

## 2017-04-07 DIAGNOSIS — M48061 Spinal stenosis, lumbar region without neurogenic claudication: Secondary | ICD-10-CM | POA: Diagnosis not present

## 2017-04-12 DIAGNOSIS — M545 Low back pain: Secondary | ICD-10-CM | POA: Diagnosis not present

## 2017-04-15 DIAGNOSIS — M48061 Spinal stenosis, lumbar region without neurogenic claudication: Secondary | ICD-10-CM | POA: Diagnosis not present

## 2017-04-21 DIAGNOSIS — M545 Low back pain: Secondary | ICD-10-CM | POA: Diagnosis not present

## 2017-04-21 DIAGNOSIS — M48061 Spinal stenosis, lumbar region without neurogenic claudication: Secondary | ICD-10-CM | POA: Diagnosis not present

## 2017-04-29 DIAGNOSIS — M48061 Spinal stenosis, lumbar region without neurogenic claudication: Secondary | ICD-10-CM | POA: Diagnosis not present

## 2017-05-01 DIAGNOSIS — M48062 Spinal stenosis, lumbar region with neurogenic claudication: Secondary | ICD-10-CM | POA: Diagnosis not present

## 2017-05-01 DIAGNOSIS — M4316 Spondylolisthesis, lumbar region: Secondary | ICD-10-CM | POA: Diagnosis not present

## 2017-05-08 DIAGNOSIS — M545 Low back pain: Secondary | ICD-10-CM | POA: Diagnosis not present

## 2017-05-08 DIAGNOSIS — M48061 Spinal stenosis, lumbar region without neurogenic claudication: Secondary | ICD-10-CM | POA: Diagnosis not present

## 2017-05-19 DIAGNOSIS — M48062 Spinal stenosis, lumbar region with neurogenic claudication: Secondary | ICD-10-CM | POA: Diagnosis not present

## 2017-05-19 DIAGNOSIS — M4316 Spondylolisthesis, lumbar region: Secondary | ICD-10-CM | POA: Diagnosis not present

## 2017-08-20 DIAGNOSIS — N3281 Overactive bladder: Secondary | ICD-10-CM | POA: Diagnosis not present

## 2017-08-20 DIAGNOSIS — L309 Dermatitis, unspecified: Secondary | ICD-10-CM | POA: Diagnosis not present

## 2017-08-20 DIAGNOSIS — M150 Primary generalized (osteo)arthritis: Secondary | ICD-10-CM | POA: Diagnosis not present

## 2017-08-20 DIAGNOSIS — I10 Essential (primary) hypertension: Secondary | ICD-10-CM | POA: Diagnosis not present

## 2018-03-02 DIAGNOSIS — I10 Essential (primary) hypertension: Secondary | ICD-10-CM | POA: Diagnosis not present

## 2018-03-02 DIAGNOSIS — M150 Primary generalized (osteo)arthritis: Secondary | ICD-10-CM | POA: Diagnosis not present

## 2018-03-02 DIAGNOSIS — N3281 Overactive bladder: Secondary | ICD-10-CM | POA: Diagnosis not present

## 2018-03-20 DIAGNOSIS — M545 Low back pain: Secondary | ICD-10-CM | POA: Diagnosis not present

## 2018-03-20 DIAGNOSIS — M48061 Spinal stenosis, lumbar region without neurogenic claudication: Secondary | ICD-10-CM | POA: Diagnosis not present

## 2018-05-08 DIAGNOSIS — M48061 Spinal stenosis, lumbar region without neurogenic claudication: Secondary | ICD-10-CM | POA: Diagnosis not present

## 2018-05-08 DIAGNOSIS — M545 Low back pain: Secondary | ICD-10-CM | POA: Diagnosis not present

## 2018-06-23 DIAGNOSIS — M48061 Spinal stenosis, lumbar region without neurogenic claudication: Secondary | ICD-10-CM | POA: Diagnosis not present

## 2018-06-23 DIAGNOSIS — M545 Low back pain: Secondary | ICD-10-CM | POA: Diagnosis not present

## 2018-07-06 DIAGNOSIS — M48061 Spinal stenosis, lumbar region without neurogenic claudication: Secondary | ICD-10-CM | POA: Diagnosis not present

## 2018-07-06 DIAGNOSIS — M545 Low back pain: Secondary | ICD-10-CM | POA: Diagnosis not present

## 2018-08-17 DIAGNOSIS — J069 Acute upper respiratory infection, unspecified: Secondary | ICD-10-CM | POA: Diagnosis not present

## 2018-09-09 DIAGNOSIS — N3281 Overactive bladder: Secondary | ICD-10-CM | POA: Diagnosis not present

## 2018-09-09 DIAGNOSIS — M150 Primary generalized (osteo)arthritis: Secondary | ICD-10-CM | POA: Diagnosis not present

## 2018-09-09 DIAGNOSIS — I10 Essential (primary) hypertension: Secondary | ICD-10-CM | POA: Diagnosis not present

## 2018-10-20 DIAGNOSIS — M48062 Spinal stenosis, lumbar region with neurogenic claudication: Secondary | ICD-10-CM | POA: Diagnosis not present

## 2018-10-20 DIAGNOSIS — M4316 Spondylolisthesis, lumbar region: Secondary | ICD-10-CM | POA: Diagnosis not present

## 2019-01-22 ENCOUNTER — Ambulatory Visit (HOSPITAL_COMMUNITY)
Admission: EM | Admit: 2019-01-22 | Discharge: 2019-01-22 | Disposition: A | Discharge status: Home / Self Care | Payer: Medicare Other | Attending: Family Medicine | Admitting: Family Medicine

## 2019-01-22 ENCOUNTER — Encounter (HOSPITAL_COMMUNITY): Payer: Self-pay | Admitting: Emergency Medicine

## 2019-01-22 ENCOUNTER — Other Ambulatory Visit: Payer: Self-pay

## 2019-01-22 DIAGNOSIS — S0181XA Laceration without foreign body of other part of head, initial encounter: Secondary | ICD-10-CM

## 2019-01-22 DIAGNOSIS — S0990XA Unspecified injury of head, initial encounter: Secondary | ICD-10-CM

## 2019-01-22 HISTORY — DX: Spinal stenosis, site unspecified: M48.00

## 2019-01-22 NOTE — Discharge Instructions (Signed)

## 2019-01-22 NOTE — ED Provider Notes (Signed)
[place]   [id number] 01/22/19 Arrival Time: 9211  ASSESSMENT & PLAN:  1. Minor head injury without loss of consciousness, initial encounter   2. Facial laceration, initial encounter     All instructions discussed with her son who is present. He is comfortable with overnight observation s/p head injury. Normal neurologic exam. No suspicion for ICH, SAH, or skull fracture. No indication for urgent neurodiagnostic imaging at this time.  Procedure: Verbal consent obtained. Patient provided with risks and alternatives to the procedure. Wound copiously irrigated with NS then cleansed with betadine. Local anesthesia: Lidocaine 1% with epinephrine. Wound carefully explored. No foreign body or nonviable tissue were noted. Using sterile technique, 5 interrupted 6-0 Prolene sutures were placed to reapproximate the wound. Procedure tolerated well. No complications. Minimal bleeding. Advised to look for and return for any signs of infection such as redness, swelling, discharge, or worsening pain. Return for suture removal in 5-7 days.   Discharge Instructions     Follow up with your primary care doctor or here within 48-72 hours. Do your best to ensure adequate rest. If not allergic, take acetaminophen (Tylenol) every 4-6 hours as needed for discomfort. Often individuals will develop a headache associated with mild nausea in the days or hours after a head injury. This is called a concussion and may require further follow up.  Please seek prompt medical care if:  You have: ? A very bad (severe) headache that is not helped by medicine. ? Trouble walking or weakness in your arms and legs. ? Clear or bloody fluid coming from your nose or ears. ? Changes in your seeing (vision). ? Jerky movements that you cannot control (seizure).  You throw up (vomit).  Your symptoms get worse.  You lose balance.  Your speech is slurred.  You pass out.  You are sleepier and have trouble  staying awake.  The black centers of your eyes (pupils) change in size.  These symptoms may be an emergency. Do not wait to see if the symptoms will go away. Get medical help right away. Call your local emergency services. Do not drive yourself to the hospital.    Will use OTC analgesics as needed for discomfort.  Follow-up Information    [facility].   Specialty: Urgent Care Why: 5-7 days for suture removal. Contact information: [place] [phone]         Reviewed expectations re: course of current medical issues. Questions answered. Outlined signs and symptoms indicating need for more acute intervention. Patient verbalized understanding. After Visit Summary given.  SUBJECTIVE: History from: patient and her son who checks on her daily. [name] is a 85 y.o. female who reports falling this morning and hitting her head against a table. Point of impact: above R eye with resulting laceration; moderate bleeding; controlled. LOC: no loss of consciousness. Denies retrograde and anterograde amnesia. Ambulatory since injury and without difficulty; no dysequilibrium. No headache. No extremity sensation changes or weakness. No associated n/v. No visual changes. Normal bowel and bladder habits. Son reports she is acting her normal self. Normal PO intake without n/v.  Denies: confusion, dizziness, feeling "out of it", ringing in ears, sensitivity to light and noise, slurred speech and weakness. History of head injury or concussion: no. Feels Td is UTD.  ROS: As per HPI. All other systems negative.  OBJECTIVE:  Vitals:   01/22/19 1125  BP: (!) 151/58  Pulse: (!) 58  Resp: 18  Temp: 98.8 F (37.1 C)  TempSrc: Oral  SpO2: 96%    GCS: 15 General appearance: alert; no distress HEENT: normocephalic; atraumatic; conjunctivae normal; TMs normal; oral mucosa normal; PERRLA; EOMI Neck: supple with  FROM; no midline cervical tenderness or deformity; no anterior mass or crepitus; trachea midline Lungs: clear to auscultation bilaterally Heart: slight bradycardia; regular Abdomen: soft, non-tender; no bruising Back: no midline tenderness Extremities: moves all extremities normally; no cyanosis or edema; symmetrical with no gross deformities Skin: warm and dry; approx 1.5 cm linear laceration above R lateral eye; no active bleeding Neurologic: normal gait Psychological: alert and cooperative; normal mood and affect  History reviewed. No pertinent surgical history.  PMH: Spondylolisthesis, HTN  FH: HTN  Social History   Socioeconomic History   Marital status: Widowed    Spouse name: Not on file   Number of children: Not on file   Years of education: Not on file   Highest education level: Not on file  Occupational History   Not on file  Social Needs   Financial resource strain: Not on file   Food insecurity    Worry: Not on file    Inability: Not on file   Transportation needs    Medical: Not on file    Non-medical: Not on file  Tobacco Use   Smoking status: Never Smoker   Smokeless tobacco: Never Used  Substance and Sexual Activity   Alcohol use: Not Currently   Drug use: Never   Sexual activity: Not on file  Lifestyle   Physical activity    Days per week: Not on file    Minutes per session: Not on file   Stress: Not on file  Relationships   Social connections    Talks on phone: Not on file    Gets together: Not on file    Attends religious service: Not on file    Active member of club or organization: Not on file    Attends meetings of clubs or organizations: Not on file    Relationship status: Not on file  Other Topics Concern   Not on file  Social History Narrative   Not on file        Mardella LaymanHagler, Clela Hagadorn, MD 01/22/19 [phone]

## 2019-01-22 NOTE — ED Notes (Signed)
Nonstick dressing applied to sutures on R eyebrow

## 2019-01-22 NOTE — ED Triage Notes (Signed)
Pt here for trip and fall today with head laceration; bleeding controlled; no blood thinners or LOC

## 2019-02-01 DIAGNOSIS — S0181XD Laceration without foreign body of other part of head, subsequent encounter: Secondary | ICD-10-CM | POA: Diagnosis not present

## 2019-02-01 DIAGNOSIS — W19XXXD Unspecified fall, subsequent encounter: Secondary | ICD-10-CM | POA: Diagnosis not present

## 2019-02-01 DIAGNOSIS — Z4802 Encounter for removal of sutures: Secondary | ICD-10-CM | POA: Diagnosis not present

## 2019-03-12 DIAGNOSIS — I10 Essential (primary) hypertension: Secondary | ICD-10-CM | POA: Diagnosis not present

## 2019-03-12 DIAGNOSIS — N3281 Overactive bladder: Secondary | ICD-10-CM | POA: Diagnosis not present

## 2019-03-12 DIAGNOSIS — R21 Rash and other nonspecific skin eruption: Secondary | ICD-10-CM | POA: Diagnosis not present

## 2019-03-12 DIAGNOSIS — M150 Primary generalized (osteo)arthritis: Secondary | ICD-10-CM | POA: Diagnosis not present

## 2019-03-15 DIAGNOSIS — I10 Essential (primary) hypertension: Secondary | ICD-10-CM | POA: Diagnosis not present

## 2019-09-13 DIAGNOSIS — M150 Primary generalized (osteo)arthritis: Secondary | ICD-10-CM | POA: Diagnosis not present

## 2019-09-13 DIAGNOSIS — N3281 Overactive bladder: Secondary | ICD-10-CM | POA: Diagnosis not present

## 2019-09-13 DIAGNOSIS — I10 Essential (primary) hypertension: Secondary | ICD-10-CM | POA: Diagnosis not present

## 2020-03-13 DIAGNOSIS — I10 Essential (primary) hypertension: Secondary | ICD-10-CM | POA: Diagnosis not present

## 2020-03-13 DIAGNOSIS — N3281 Overactive bladder: Secondary | ICD-10-CM | POA: Diagnosis not present

## 2020-03-13 DIAGNOSIS — M150 Primary generalized (osteo)arthritis: Secondary | ICD-10-CM | POA: Diagnosis not present

## 2020-04-01 ENCOUNTER — Encounter (HOSPITAL_COMMUNITY): Payer: Self-pay | Admitting: *Deleted

## 2020-04-01 ENCOUNTER — Emergency Department (HOSPITAL_COMMUNITY): Payer: Medicare Other

## 2020-04-01 ENCOUNTER — Other Ambulatory Visit: Payer: Self-pay

## 2020-04-01 ENCOUNTER — Emergency Department (HOSPITAL_COMMUNITY)
Admission: EM | Admit: 2020-04-01 | Discharge: 2020-04-02 | Disposition: A | Discharge status: Home / Self Care | Payer: Medicare Other | Attending: Emergency Medicine | Admitting: Emergency Medicine

## 2020-04-01 ENCOUNTER — Ambulatory Visit (HOSPITAL_COMMUNITY)
Admission: EM | Admit: 2020-04-01 | Discharge: 2020-04-01 | Disposition: A | Discharge status: Home / Self Care | Payer: Medicare Other

## 2020-04-01 DIAGNOSIS — W010XXA Fall on same level from slipping, tripping and stumbling without subsequent striking against object, initial encounter: Secondary | ICD-10-CM | POA: Diagnosis not present

## 2020-04-01 DIAGNOSIS — Z5321 Procedure and treatment not carried out due to patient leaving prior to being seen by health care provider: Secondary | ICD-10-CM | POA: Insufficient documentation

## 2020-04-01 DIAGNOSIS — I6782 Cerebral ischemia: Secondary | ICD-10-CM | POA: Diagnosis not present

## 2020-04-01 DIAGNOSIS — S0181XA Laceration without foreign body of other part of head, initial encounter: Secondary | ICD-10-CM | POA: Diagnosis not present

## 2020-04-01 DIAGNOSIS — Y9289 Other specified places as the place of occurrence of the external cause: Secondary | ICD-10-CM | POA: Diagnosis not present

## 2020-04-01 DIAGNOSIS — Y999 Unspecified external cause status: Secondary | ICD-10-CM | POA: Insufficient documentation

## 2020-04-01 DIAGNOSIS — G319 Degenerative disease of nervous system, unspecified: Secondary | ICD-10-CM | POA: Diagnosis not present

## 2020-04-01 DIAGNOSIS — S0101XA Laceration without foreign body of scalp, initial encounter: Secondary | ICD-10-CM | POA: Diagnosis not present

## 2020-04-01 DIAGNOSIS — Y93H2 Activity, gardening and landscaping: Secondary | ICD-10-CM | POA: Insufficient documentation

## 2020-04-01 HISTORY — DX: Essential (primary) hypertension: I10

## 2020-04-01 NOTE — ED Triage Notes (Signed)
Pt reports doing yardwork when she stumbled over some bricks, causing her to fall approx 45 min ago.  States hit her head against a brick on the ground.  Denies LOC, neck pain, or HA.  Denies any nausea.  Per son, pt behaving normal and conversing appropriately.  Pt and son deny hx blood thinners. Also c/o left knee pain with abrasion; abrasion noted to right elbow also.  Large laceration to left posterior scalp with no active bleeding.

## 2020-04-01 NOTE — ED Triage Notes (Signed)
Per provider, Leanna Battles NP, patient needs to be evaluated by ED d/t risk for bleed.  Pt very hard of hearing.  Spoke with son and explained provider's recommendation.  Son will transport pt to [facility] ED.   Patient is being discharged from the Urgent Care and sent to the Emergency Department via private vehicle . Per provider, patient is in need of higher level of care due to age and risk for bleed. Patient (son) is aware and verbalizes understanding of plan of care.  Vitals:   04/01/20 1632  BP: (!) 151/59  Pulse: 70  Temp: 98.3 F (36.8 C)  SpO2: 95%  .

## 2020-04-01 NOTE — ED Notes (Signed)
Pt discussed with dr Effie Shy  He ordered a c-t of the head

## 2020-04-01 NOTE — ED Triage Notes (Signed)
The pt was trimming her hedges and stumbled backward striking the back of her head on a brick wall  Laceration controlled with a bandage  She also has an abrasion to the lt knee

## 2020-04-01 NOTE — ED Notes (Signed)
Pt did not want to wait felt that she did not need to see a doctor.

## 2020-04-04 DIAGNOSIS — S0101XA Laceration without foreign body of scalp, initial encounter: Secondary | ICD-10-CM | POA: Diagnosis not present

## 2020-04-04 DIAGNOSIS — S0990XA Unspecified injury of head, initial encounter: Secondary | ICD-10-CM | POA: Diagnosis not present

## 2020-05-02 DIAGNOSIS — R002 Palpitations: Secondary | ICD-10-CM | POA: Diagnosis not present

## 2020-05-02 DIAGNOSIS — R42 Dizziness and giddiness: Secondary | ICD-10-CM | POA: Diagnosis not present

## 2020-07-26 DIAGNOSIS — L309 Dermatitis, unspecified: Secondary | ICD-10-CM | POA: Diagnosis not present

## 2020-09-13 DIAGNOSIS — Z0000 Encounter for general adult medical examination without abnormal findings: Secondary | ICD-10-CM | POA: Diagnosis not present

## 2020-09-13 DIAGNOSIS — M150 Primary generalized (osteo)arthritis: Secondary | ICD-10-CM | POA: Diagnosis not present

## 2020-09-13 DIAGNOSIS — I10 Essential (primary) hypertension: Secondary | ICD-10-CM | POA: Diagnosis not present

## 2021-03-13 DIAGNOSIS — N3281 Overactive bladder: Secondary | ICD-10-CM | POA: Diagnosis not present

## 2021-03-13 DIAGNOSIS — I10 Essential (primary) hypertension: Secondary | ICD-10-CM | POA: Diagnosis not present

## 2021-03-13 DIAGNOSIS — M150 Primary generalized (osteo)arthritis: Secondary | ICD-10-CM | POA: Diagnosis not present

## 2021-09-21 DIAGNOSIS — E669 Obesity, unspecified: Secondary | ICD-10-CM | POA: Diagnosis not present

## 2021-09-21 DIAGNOSIS — Z0000 Encounter for general adult medical examination without abnormal findings: Secondary | ICD-10-CM | POA: Diagnosis not present

## 2021-09-21 DIAGNOSIS — M150 Primary generalized (osteo)arthritis: Secondary | ICD-10-CM | POA: Diagnosis not present

## 2021-09-21 DIAGNOSIS — R634 Abnormal weight loss: Secondary | ICD-10-CM | POA: Diagnosis not present

## 2021-09-21 DIAGNOSIS — I10 Essential (primary) hypertension: Secondary | ICD-10-CM | POA: Diagnosis not present

## 2021-09-21 DIAGNOSIS — G4700 Insomnia, unspecified: Secondary | ICD-10-CM | POA: Diagnosis not present

## 2021-11-01 ENCOUNTER — Other Ambulatory Visit: Payer: Self-pay

## 2021-11-01 ENCOUNTER — Encounter (HOSPITAL_BASED_OUTPATIENT_CLINIC_OR_DEPARTMENT_OTHER): Payer: Self-pay | Admitting: Emergency Medicine

## 2021-11-01 ENCOUNTER — Emergency Department (HOSPITAL_BASED_OUTPATIENT_CLINIC_OR_DEPARTMENT_OTHER): Payer: Medicare Other | Admitting: Radiology

## 2021-11-01 ENCOUNTER — Inpatient Hospital Stay (HOSPITAL_BASED_OUTPATIENT_CLINIC_OR_DEPARTMENT_OTHER)
Admission: EM | Admit: 2021-11-01 | Discharge: 2021-11-09 | DRG: 064 | Disposition: A | Discharge status: Hospice | Payer: Medicare Other | Attending: Internal Medicine | Admitting: Internal Medicine

## 2021-11-01 ENCOUNTER — Emergency Department (HOSPITAL_BASED_OUTPATIENT_CLINIC_OR_DEPARTMENT_OTHER): Payer: Medicare Other

## 2021-11-01 DIAGNOSIS — I4891 Unspecified atrial fibrillation: Secondary | ICD-10-CM | POA: Diagnosis present

## 2021-11-01 DIAGNOSIS — E876 Hypokalemia: Secondary | ICD-10-CM | POA: Diagnosis present

## 2021-11-01 DIAGNOSIS — G9341 Metabolic encephalopathy: Secondary | ICD-10-CM | POA: Diagnosis not present

## 2021-11-01 DIAGNOSIS — S80211A Abrasion, right knee, initial encounter: Secondary | ICD-10-CM | POA: Diagnosis present

## 2021-11-01 DIAGNOSIS — M25422 Effusion, left elbow: Secondary | ICD-10-CM | POA: Diagnosis not present

## 2021-11-01 DIAGNOSIS — Z20822 Contact with and (suspected) exposure to covid-19: Secondary | ICD-10-CM | POA: Diagnosis present

## 2021-11-01 DIAGNOSIS — E870 Hyperosmolality and hypernatremia: Secondary | ICD-10-CM | POA: Diagnosis not present

## 2021-11-01 DIAGNOSIS — I351 Nonrheumatic aortic (valve) insufficiency: Secondary | ICD-10-CM | POA: Diagnosis not present

## 2021-11-01 DIAGNOSIS — I10 Essential (primary) hypertension: Secondary | ICD-10-CM | POA: Diagnosis not present

## 2021-11-01 DIAGNOSIS — S0990XA Unspecified injury of head, initial encounter: Secondary | ICD-10-CM | POA: Diagnosis not present

## 2021-11-01 DIAGNOSIS — R0602 Shortness of breath: Secondary | ICD-10-CM | POA: Diagnosis not present

## 2021-11-01 DIAGNOSIS — Z515 Encounter for palliative care: Secondary | ICD-10-CM | POA: Diagnosis not present

## 2021-11-01 DIAGNOSIS — I6310 Cerebral infarction due to embolism of unspecified precerebral artery: Secondary | ICD-10-CM | POA: Diagnosis not present

## 2021-11-01 DIAGNOSIS — R296 Repeated falls: Secondary | ICD-10-CM

## 2021-11-01 DIAGNOSIS — E872 Acidosis, unspecified: Secondary | ICD-10-CM | POA: Diagnosis not present

## 2021-11-01 DIAGNOSIS — R000 Tachycardia, unspecified: Secondary | ICD-10-CM | POA: Diagnosis not present

## 2021-11-01 DIAGNOSIS — R339 Retention of urine, unspecified: Secondary | ICD-10-CM | POA: Diagnosis present

## 2021-11-01 DIAGNOSIS — I248 Other forms of acute ischemic heart disease: Secondary | ICD-10-CM | POA: Diagnosis not present

## 2021-11-01 DIAGNOSIS — E785 Hyperlipidemia, unspecified: Secondary | ICD-10-CM | POA: Diagnosis present

## 2021-11-01 DIAGNOSIS — A419 Sepsis, unspecified organism: Secondary | ICD-10-CM | POA: Diagnosis present

## 2021-11-01 DIAGNOSIS — S299XXA Unspecified injury of thorax, initial encounter: Secondary | ICD-10-CM | POA: Diagnosis not present

## 2021-11-01 DIAGNOSIS — K5900 Constipation, unspecified: Secondary | ICD-10-CM | POA: Diagnosis present

## 2021-11-01 DIAGNOSIS — F5104 Psychophysiologic insomnia: Secondary | ICD-10-CM | POA: Diagnosis present

## 2021-11-01 DIAGNOSIS — I7121 Aneurysm of the ascending aorta, without rupture: Secondary | ICD-10-CM | POA: Diagnosis not present

## 2021-11-01 DIAGNOSIS — R778 Other specified abnormalities of plasma proteins: Secondary | ICD-10-CM | POA: Diagnosis present

## 2021-11-01 DIAGNOSIS — R509 Fever, unspecified: Secondary | ICD-10-CM | POA: Diagnosis present

## 2021-11-01 DIAGNOSIS — R930 Abnormal findings on diagnostic imaging of skull and head, not elsewhere classified: Secondary | ICD-10-CM | POA: Diagnosis present

## 2021-11-01 DIAGNOSIS — S80212A Abrasion, left knee, initial encounter: Secondary | ICD-10-CM | POA: Diagnosis present

## 2021-11-01 DIAGNOSIS — W07XXXA Fall from chair, initial encounter: Secondary | ICD-10-CM | POA: Diagnosis present

## 2021-11-01 DIAGNOSIS — E860 Dehydration: Secondary | ICD-10-CM | POA: Diagnosis present

## 2021-11-01 DIAGNOSIS — L8990 Pressure ulcer of unspecified site, unspecified stage: Secondary | ICD-10-CM | POA: Diagnosis present

## 2021-11-01 DIAGNOSIS — Z66 Do not resuscitate: Secondary | ICD-10-CM | POA: Diagnosis not present

## 2021-11-01 DIAGNOSIS — Z7189 Other specified counseling: Secondary | ICD-10-CM | POA: Diagnosis not present

## 2021-11-01 DIAGNOSIS — S50312A Abrasion of left elbow, initial encounter: Secondary | ICD-10-CM | POA: Diagnosis present

## 2021-11-01 DIAGNOSIS — I4892 Unspecified atrial flutter: Secondary | ICD-10-CM | POA: Diagnosis present

## 2021-11-01 DIAGNOSIS — I6340 Cerebral infarction due to embolism of unspecified cerebral artery: Principal | ICD-10-CM | POA: Diagnosis present

## 2021-11-01 DIAGNOSIS — I712 Thoracic aortic aneurysm, without rupture, unspecified: Secondary | ICD-10-CM | POA: Diagnosis not present

## 2021-11-01 DIAGNOSIS — R627 Adult failure to thrive: Secondary | ICD-10-CM | POA: Diagnosis not present

## 2021-11-01 DIAGNOSIS — R109 Unspecified abdominal pain: Secondary | ICD-10-CM | POA: Diagnosis not present

## 2021-11-01 DIAGNOSIS — S0003XA Contusion of scalp, initial encounter: Secondary | ICD-10-CM | POA: Diagnosis present

## 2021-11-01 DIAGNOSIS — L89152 Pressure ulcer of sacral region, stage 2: Secondary | ICD-10-CM | POA: Diagnosis present

## 2021-11-01 DIAGNOSIS — Z9181 History of falling: Secondary | ICD-10-CM

## 2021-11-01 DIAGNOSIS — H9190 Unspecified hearing loss, unspecified ear: Secondary | ICD-10-CM | POA: Diagnosis present

## 2021-11-01 DIAGNOSIS — Z6821 Body mass index (BMI) 21.0-21.9, adult: Secondary | ICD-10-CM

## 2021-11-01 DIAGNOSIS — W19XXXA Unspecified fall, initial encounter: Secondary | ICD-10-CM

## 2021-11-01 DIAGNOSIS — I69318 Other symptoms and signs involving cognitive functions following cerebral infarction: Secondary | ICD-10-CM | POA: Diagnosis not present

## 2021-11-01 DIAGNOSIS — G9340 Encephalopathy, unspecified: Secondary | ICD-10-CM | POA: Diagnosis not present

## 2021-11-01 DIAGNOSIS — M1711 Unilateral primary osteoarthritis, right knee: Secondary | ICD-10-CM | POA: Diagnosis not present

## 2021-11-01 DIAGNOSIS — E878 Other disorders of electrolyte and fluid balance, not elsewhere classified: Secondary | ICD-10-CM | POA: Diagnosis not present

## 2021-11-01 DIAGNOSIS — Z823 Family history of stroke: Secondary | ICD-10-CM

## 2021-11-01 DIAGNOSIS — S199XXA Unspecified injury of neck, initial encounter: Secondary | ICD-10-CM | POA: Diagnosis not present

## 2021-11-01 DIAGNOSIS — M1712 Unilateral primary osteoarthritis, left knee: Secondary | ICD-10-CM | POA: Diagnosis not present

## 2021-11-01 DIAGNOSIS — S3991XA Unspecified injury of abdomen, initial encounter: Secondary | ICD-10-CM | POA: Diagnosis not present

## 2021-11-01 DIAGNOSIS — I639 Cerebral infarction, unspecified: Secondary | ICD-10-CM | POA: Diagnosis not present

## 2021-11-01 DIAGNOSIS — Z043 Encounter for examination and observation following other accident: Secondary | ICD-10-CM | POA: Diagnosis not present

## 2021-11-01 DIAGNOSIS — S3993XA Unspecified injury of pelvis, initial encounter: Secondary | ICD-10-CM | POA: Diagnosis not present

## 2021-11-01 DIAGNOSIS — R638 Other symptoms and signs concerning food and fluid intake: Secondary | ICD-10-CM | POA: Diagnosis not present

## 2021-11-01 DIAGNOSIS — R7989 Other specified abnormal findings of blood chemistry: Secondary | ICD-10-CM | POA: Diagnosis present

## 2021-11-01 DIAGNOSIS — Z781 Physical restraint status: Secondary | ICD-10-CM

## 2021-11-01 LAB — COMPREHENSIVE METABOLIC PANEL
ALT: 15 U/L (ref 0–44)
AST: 27 U/L (ref 15–41)
Albumin: 4.2 g/dL (ref 3.5–5.0)
Alkaline Phosphatase: 47 U/L (ref 38–126)
Anion gap: 9 (ref 5–15)
BUN: 36 mg/dL — ABNORMAL HIGH (ref 8–23)
CO2: 28 mmol/L (ref 22–32)
Calcium: 11 mg/dL — ABNORMAL HIGH (ref 8.9–10.3)
Chloride: 105 mmol/L (ref 98–111)
Creatinine, Ser: 0.66 mg/dL (ref 0.44–1.00)
GFR, Estimated: >60 mL/min (ref >60)
Glucose, Bld: 181 mg/dL — ABNORMAL HIGH (ref 70–99)
Potassium: 3.3 mmol/L — ABNORMAL LOW (ref 3.5–5.1)
Sodium: 142 mmol/L (ref 135–145)
Total Bilirubin: 1.7 mg/dL — ABNORMAL HIGH (ref 0.3–1.2)
Total Protein: 7.3 g/dL (ref 6.5–8.1)

## 2021-11-01 LAB — CBC WITH DIFFERENTIAL/PLATELET
Abs Immature Granulocytes: 0.09 10*3/uL — ABNORMAL HIGH (ref 0.00–0.07)
Basophils Absolute: 0 10*3/uL (ref 0.0–0.1)
Basophils Relative: 0 %
Eosinophils Absolute: 0 10*3/uL (ref 0.0–0.5)
Eosinophils Relative: 0 %
HCT: 40.7 % (ref 36.0–46.0)
Hemoglobin: 13.4 g/dL (ref 12.0–15.0)
Immature Granulocytes: 1 %
Lymphocytes Relative: 8 %
Lymphs Abs: 1.2 10*3/uL (ref 0.7–4.0)
MCH: 29 pg (ref 26.0–34.0)
MCHC: 32.9 g/dL (ref 30.0–36.0)
MCV: 88.1 fL (ref 80.0–100.0)
Monocytes Absolute: 1 10*3/uL (ref 0.1–1.0)
Monocytes Relative: 7 %
Neutro Abs: 12.7 10*3/uL — ABNORMAL HIGH (ref 1.7–7.7)
Neutrophils Relative %: 84 %
Platelets: 226 10*3/uL (ref 150–400)
RBC: 4.62 MIL/uL (ref 3.87–5.11)
RDW: 15.2 % (ref 11.5–15.5)
WBC: 15.1 10*3/uL — ABNORMAL HIGH (ref 4.0–10.5)
nRBC: 0 % (ref 0.0–0.2)

## 2021-11-01 LAB — RESP PANEL BY RT-PCR (FLU A&B, COVID) ARPGX2
Influenza A by PCR: NEGATIVE
Influenza B by PCR: NEGATIVE
SARS Coronavirus 2 by RT PCR: NEGATIVE

## 2021-11-01 LAB — LACTIC ACID, PLASMA
Lactic Acid, Venous: 3.1 mmol/L (ref 0.5–1.9)
Lactic Acid, Venous: 1.5 mmol/L (ref 0.5–1.9)

## 2021-11-01 LAB — URINALYSIS, ROUTINE W REFLEX MICROSCOPIC
Bilirubin Urine: NEGATIVE
Glucose, UA: NEGATIVE mg/dL
Hgb urine dipstick: NEGATIVE
Leukocytes,Ua: NEGATIVE
Nitrite: NEGATIVE
Protein, ur: 30 mg/dL — AB
Specific Gravity, Urine: >1.046 — ABNORMAL HIGH (ref 1.005–1.030)
pH: 6 (ref 5.0–8.0)

## 2021-11-01 LAB — TROPONIN I (HIGH SENSITIVITY)
Troponin I (High Sensitivity): 65 ng/L — ABNORMAL HIGH (ref <18)
Troponin I (High Sensitivity): 79 ng/L — ABNORMAL HIGH (ref <18)

## 2021-11-01 LAB — CK: Total CK: 112 U/L (ref 38–234)

## 2021-11-01 MED ORDER — SODIUM CHLORIDE 0.9 % IV BOLUS
500.0000 mL | Freq: Once | INTRAVENOUS | Status: AC
  Administered 2021-11-01: 500 mL via INTRAVENOUS

## 2021-11-01 MED ORDER — LORAZEPAM 2 MG/ML IJ SOLN
1.0000 mg | Freq: Once | INTRAMUSCULAR | Status: AC
  Administered 2021-11-01: 1 mg via INTRAVENOUS
  Filled 2021-11-01: qty 1

## 2021-11-01 MED ORDER — SODIUM CHLORIDE 0.9 % IV SOLN
1.0000 g | Freq: Once | INTRAVENOUS | Status: AC
  Administered 2021-11-01: 1 g via INTRAVENOUS
  Filled 2021-11-01: qty 10

## 2021-11-01 MED ORDER — ACETAMINOPHEN 325 MG PO TABS
650.0000 mg | ORAL_TABLET | Freq: Once | ORAL | Status: AC
Start: 2021-11-01 — End: 2021-11-01
  Administered 2021-11-01: 650 mg via ORAL
  Filled 2021-11-01: qty 2

## 2021-11-01 MED ORDER — SODIUM CHLORIDE 0.9 % IV BOLUS: 1000.0000 mL | Freq: Once | INTRAVENOUS | Status: DC

## 2021-11-01 MED ORDER — DILTIAZEM HCL-DEXTROSE 125-5 MG/125ML-% IV SOLN (PREMIX)
5.0000 mg/h | INTRAVENOUS | Status: DC
  Administered 2021-11-01: 5 mg/h via INTRAVENOUS
  Administered 2021-11-02 – 2021-11-03 (×3): 15 mg/h via INTRAVENOUS
  Filled 2021-11-01 (×5): qty 125

## 2021-11-01 MED ORDER — IOHEXOL 300 MG/ML  SOLN
100.0000 mL | Freq: Once | INTRAMUSCULAR | Status: AC | PRN
Start: 2021-11-01 — End: 2021-11-01
  Administered 2021-11-01: 75 mL via INTRAVENOUS

## 2021-11-01 MED ORDER — VANCOMYCIN HCL IN DEXTROSE 1-5 GM/200ML-% IV SOLN
1000.0000 mg | Freq: Once | INTRAVENOUS | Status: AC
  Administered 2021-11-01: 1000 mg via INTRAVENOUS
  Filled 2021-11-01: qty 200

## 2021-11-01 NOTE — ED Notes (Signed)
Patient transported to CT 

## 2021-11-01 NOTE — ED Notes (Signed)
Pt able to control lips mouth and swallow without stopping  ?

## 2021-11-01 NOTE — ED Notes (Signed)
Secure chat sent at this time to Dr Silverio Lay re: ongoing uncontrolled A-Fib; currently 140s; have requested med order (ie: Cardizem/Metoprolol) if appropriate- awaiting reply ?

## 2021-11-01 NOTE — ED Notes (Addendum)
ED Provider at bedside speaking with patient daughter; pt lying quietly awake in bed - calm at this time.  RR even and unlabored on 2L O2 via [place] 100%.  Lung sounds CTA.  S1 and S2 irregular - uncontrolled A-Fib HR 110.  Continuous cardiac and pulse ox maintained.   ?

## 2021-11-01 NOTE — ED Notes (Signed)
RT assessed pt post oxygen d/c. Pt Bilat BS diminished with RR of 19. Pts current respiratory status is stable w/no distress noted at this time.  ?

## 2021-11-01 NOTE — ED Provider Notes (Signed)
?[facility] EMERGENCY DEPT ?Provider Note ? ? ?CSN: [id number] ?Arrival date & time: 11/01/21  1620 ? [id number]?  ? ?History ? ?Chief Complaint  ?Patient presents with  ? Fall  ? ? ?[name] is a 88 y.o. female here presenting with a fall.  Patient has a history of hypertension.  Patient lives at home currently.  She has been having frequent falls.  However she refused to stay with her daughter.  Her daughter instead comes to visit her every day.  She was normal yesterday.  When the daughter came to visit her today she noticed that patient has fallen out of the chair and is on the floor and unable to get up.  Patient is unable to give me much history.  EMS was called and patient was in rapid A-fib which is new for her.  However family did not [facility] instead brought her by private vehicle to [facility]  ? ?The history is provided by the patient.  ? ?  ? ?Home Medications ?Prior to Admission medications   ?Medication Sig Start Date End Date Taking? Authorizing Provider  ?UNKNOWN TO PATIENT HTN med    [provider]  ?   ? ?Allergies    ?Patient has no known allergies.   ? ?Review of Systems   ?Review of Systems  ?Neurological:  Positive for dizziness and syncope.  ? ?Physical Exam ?Updated Vital Signs ?BP (!) 139/128   Pulse 97   Temp 98.1 ?F (36.7 ?C) (Oral)   Resp (!) 26   Ht 5\' 4"  (1.626 m)   Wt 59 kg   SpO2 97%   BMI 22.31 kg/m?  ?Physical Exam ?Vitals and nursing note reviewed.  ?Constitutional:   ?   Comments: Chronically ill, hard of hearing  ?HENT:  ?   Head:  ?   Comments: Small posterior scalp hematoma  ?   Nose: Nose normal.  ?   Mouth/Throat:  ?   Mouth: Mucous membranes are dry.  ?Eyes:  ?   Extraocular Movements: Extraocular movements intact.  ?   Pupils: Pupils are equal, round, and reactive to light.  ?Neck:  ?   Comments: No obvious midline tenderness  ?Cardiovascular:  ?   Rate and Rhythm: Normal rate and regular rhythm.  ?   Pulses: Normal pulses.   ?   Heart sounds: Normal heart sounds.  ?Pulmonary:  ?   Effort: Pulmonary effort is normal.  ?   Breath sounds: Normal breath sounds.  ?Abdominal:  ?   General: Abdomen is flat.  ?   Palpations: Abdomen is soft.  ?Musculoskeletal:  ?   Comments: Mild spinal tenderness, abrasion L elbow, abrasions bilateral knees   ?Skin: ?   General: Skin is warm.  ?   Capillary Refill: Capillary refill takes less than 2 seconds.  ?Neurological:  ?   Comments: Demented,  confused, moving all extremities, not following commands   ?Psychiatric:  ?   Comments: Unable   ? ? ?ED Results / Procedures / Treatments   ?Labs ?(all labs ordered are listed, but only abnormal results are displayed) ?Labs Reviewed  ?CBC WITH DIFFERENTIAL/PLATELET - Abnormal; Notable for the following components:  ?    Result Value  ? WBC 15.1 (*)   ? Neutro Abs 12.7 (*)   ? Abs Immature Granulocytes 0.09 (*)   ? All other components within normal limits  ?COMPREHENSIVE METABOLIC PANEL - Abnormal; Notable for the following components:  ?  Potassium 3.3 (*)   ? Glucose, Bld 181 (*)   ? BUN 36 (*)   ? Calcium 11.0 (*)   ? Total Bilirubin 1.7 (*)   ? All other components within normal limits  ?LACTIC ACID, PLASMA - Abnormal; Notable for the following components:  ? Lactic Acid, Venous 3.1 (*)   ? All other components within normal limits  ?TROPONIN I (HIGH SENSITIVITY) - Abnormal; Notable for the following components:  ? Troponin I (High Sensitivity) 65 (*)   ? All other components within normal limits  ?URINE CULTURE  ?CULTURE, BLOOD (ROUTINE X 2)  ?CULTURE, BLOOD (ROUTINE X 2)  ?CK  ?URINALYSIS, ROUTINE W REFLEX MICROSCOPIC  ?LACTIC ACID, PLASMA  ? ? ?EKG ?EKG Interpretation ? ?Date/Time:  Thursday November 01 2021 16:26:40 EDT ?Ventricular Rate:  154 ?PR Interval:    ?QRS Duration: 162 ?QT Interval:  298 ?QTC Calculation: 477 ?R Axis:   54 ?Text Interpretation: Critical Test Result: Arrhythmia Wide QRS tachycardia Non-specific intra-ventricular conduction block  Minimal voltage criteria for LVH, may be normal variant ( Cornell product ) Abnormal ECG When compared with ECG of 13-Oct-2010 14:07, Wide QRS tachycardia has replaced Sinus rhythm Vent. rate has increased BY  73 BPM afib new since previous Confirmed by Richardean Canal [phone]) on 11/01/2021 4:46:08 PM ? ?Radiology ?DG Elbow Complete Left ? ?Result Date: 11/01/2021 ?CLINICAL DATA:  Unwitnessed fall. EXAM: LEFT ELBOW - COMPLETE 3+ VIEW COMPARISON:  None. FINDINGS: There is no evidence of fracture, dislocation, or joint effusion. Minimal degenerative change. Soft tissues are unremarkable. IMPRESSION: No fracture or subluxation of the left elbow. Electronically Signed   By: Narda Rutherford M.D.   On: 11/01/2021 17:30  ? ?DG Knee Complete 4 Views Left ? ?Result Date: 11/01/2021 ?CLINICAL DATA:  Fall. EXAM: LEFT KNEE - COMPLETE 4+ VIEW COMPARISON:  None. FINDINGS: No evidence of fracture, dislocation, or joint effusion. There is mild medial compartment joint space narrowing and osteophyte formation. There is lateral compartment chondrocalcinosis, likely degenerative. Peripheral vascular calcifications are present. Soft tissues are otherwise unremarkable. IMPRESSION: 1. No acute bony abnormality. 2. Mild degenerative changes. Electronically Signed   By: Darliss Cheney M.D.   On: 11/01/2021 17:23  ? ?DG Knee Complete 4 Views Right ? ?Result Date: 11/01/2021 ?CLINICAL DATA:  Fall. EXAM: RIGHT KNEE - COMPLETE 4+ VIEW COMPARISON:  None. FINDINGS: No evidence of fracture, dislocation, or joint effusion. There is mild medial compartment joint space narrowing and osteophyte formation. There is lateral compartment chondrocalcinosis, likely degenerative. Peripheral vascular calcifications are present. IMPRESSION: 1. No acute bony abnormality. 2. Mild degenerative changes. Electronically Signed   By: Darliss Cheney M.D.   On: 11/01/2021 17:25   ? ?Procedures ?Procedures  ? ? ?CRITICAL CARE ?Performed by: Richardean Canal ? ? ?Total critical care  time: 30 minutes ? ?Critical care time was exclusive of separately billable procedures and treating other patients. ? ?Critical care was necessary to treat or prevent imminent or life-threatening deterioration. ? ?Critical care was time spent personally by me on the following activities: development of treatment plan with patient and/or surrogate as well as nursing, discussions with consultants, evaluation of patient's response to treatment, examination of patient, obtaining history from patient or surrogate, ordering and performing treatments and interventions, ordering and review of laboratory studies, ordering and review of radiographic studies, pulse oximetry and re-evaluation of patient's condition. ? ? ?Medications Ordered in ED ?Medications  ?cefTRIAXone (ROCEPHIN) 1 g in sodium chloride 0.9 % 100 mL IVPB (has  no administration in time range)  ?sodium chloride 0.9 % bolus 500 mL (0 mLs Intravenous Stopped 11/01/21 1735)  ?acetaminophen (TYLENOL) tablet 650 mg (650 mg Oral Given 11/01/21 1714)  ? ? ?ED Course/ Medical Decision Making/ A&P ?  ?                        ?Medical Decision Making ?KAITYLN KALLSTROM is a 88 y.o. female here presenting with altered mental status.  Patient was noted to be febrile and is in rapid A-fib.  Patient has no history of A-fib and this is new onset for her.  Concern for sepsis causing her fever and altered mental status.  We will do sepsis work-up with CBC and CMP and lactate and cultures and urinalysis and chest x-ray.  We will also get trauma scan given that she has fallen.  We will get a CK level to rule out rhabdomyolysis. We will give Tylenol and IV fluids and patient will likely need admission ? ?7:20 PM ?White blood cell count is 15.  Patient's initial lactate is 3.1 and down to 1.5 now.  CT head and cervical spine unremarkable.  CT chest showed ascending aortic aneurysm with no rupture.  Given Rocephin initially for presumed UTI but urinalysis is clear.  We will add on  vancomycin for broad-spectrum coverage.  Patient does have a stage II sacral decub ulcer.  I discussed at length regarding CODE STATUS.  Patient is technically full code but daughter is healthcare proxy and she is

## 2021-11-01 NOTE — ED Triage Notes (Addendum)
Pt last known well yesterday afternoon. Pt had an unwitnessed fall and was discovered by her daughter at approx 1330. Pt was on her back and unable to get up. Daughter thought Pt may have hit the back of her head on a Chair. EMS responeded and ran a EKG that showed Hr 151 with uncontrolled Afib. Pt and daughter did not want  to go to [facility] and drove to [facility]. ?

## 2021-11-01 NOTE — ED Notes (Signed)
Patient transported to CT on monitor with RN.  Patient very confused and fighting and pulling at cords. ?

## 2021-11-02 ENCOUNTER — Encounter (HOSPITAL_COMMUNITY): Payer: Self-pay | Admitting: Internal Medicine

## 2021-11-02 ENCOUNTER — Other Ambulatory Visit (HOSPITAL_COMMUNITY): Payer: Medicare Other

## 2021-11-02 DIAGNOSIS — I639 Cerebral infarction, unspecified: Secondary | ICD-10-CM | POA: Diagnosis not present

## 2021-11-02 DIAGNOSIS — S50312A Abrasion of left elbow, initial encounter: Secondary | ICD-10-CM | POA: Diagnosis present

## 2021-11-02 DIAGNOSIS — E876 Hypokalemia: Secondary | ICD-10-CM | POA: Diagnosis present

## 2021-11-02 DIAGNOSIS — R339 Retention of urine, unspecified: Secondary | ICD-10-CM | POA: Diagnosis present

## 2021-11-02 DIAGNOSIS — Z515 Encounter for palliative care: Secondary | ICD-10-CM | POA: Diagnosis not present

## 2021-11-02 DIAGNOSIS — R930 Abnormal findings on diagnostic imaging of skull and head, not elsewhere classified: Secondary | ICD-10-CM | POA: Diagnosis present

## 2021-11-02 DIAGNOSIS — L89152 Pressure ulcer of sacral region, stage 2: Secondary | ICD-10-CM | POA: Diagnosis present

## 2021-11-02 DIAGNOSIS — I6340 Cerebral infarction due to embolism of unspecified cerebral artery: Secondary | ICD-10-CM | POA: Diagnosis present

## 2021-11-02 DIAGNOSIS — R296 Repeated falls: Secondary | ICD-10-CM | POA: Diagnosis not present

## 2021-11-02 DIAGNOSIS — R509 Fever, unspecified: Secondary | ICD-10-CM | POA: Diagnosis present

## 2021-11-02 DIAGNOSIS — G9340 Encephalopathy, unspecified: Secondary | ICD-10-CM | POA: Diagnosis present

## 2021-11-02 DIAGNOSIS — K5900 Constipation, unspecified: Secondary | ICD-10-CM | POA: Diagnosis present

## 2021-11-02 DIAGNOSIS — E878 Other disorders of electrolyte and fluid balance, not elsewhere classified: Secondary | ICD-10-CM | POA: Diagnosis not present

## 2021-11-02 DIAGNOSIS — R778 Other specified abnormalities of plasma proteins: Secondary | ICD-10-CM | POA: Diagnosis present

## 2021-11-02 DIAGNOSIS — E860 Dehydration: Secondary | ICD-10-CM | POA: Diagnosis present

## 2021-11-02 DIAGNOSIS — G9341 Metabolic encephalopathy: Secondary | ICD-10-CM | POA: Diagnosis present

## 2021-11-02 DIAGNOSIS — I7121 Aneurysm of the ascending aorta, without rupture: Secondary | ICD-10-CM | POA: Diagnosis present

## 2021-11-02 DIAGNOSIS — R638 Other symptoms and signs concerning food and fluid intake: Secondary | ICD-10-CM | POA: Diagnosis not present

## 2021-11-02 DIAGNOSIS — W19XXXA Unspecified fall, initial encounter: Secondary | ICD-10-CM | POA: Diagnosis not present

## 2021-11-02 DIAGNOSIS — I4891 Unspecified atrial fibrillation: Secondary | ICD-10-CM | POA: Diagnosis present

## 2021-11-02 DIAGNOSIS — E870 Hyperosmolality and hypernatremia: Secondary | ICD-10-CM | POA: Diagnosis present

## 2021-11-02 DIAGNOSIS — I712 Thoracic aortic aneurysm, without rupture, unspecified: Secondary | ICD-10-CM | POA: Diagnosis not present

## 2021-11-02 DIAGNOSIS — E872 Acidosis, unspecified: Secondary | ICD-10-CM | POA: Diagnosis present

## 2021-11-02 DIAGNOSIS — I351 Nonrheumatic aortic (valve) insufficiency: Secondary | ICD-10-CM | POA: Diagnosis present

## 2021-11-02 DIAGNOSIS — I69318 Other symptoms and signs involving cognitive functions following cerebral infarction: Secondary | ICD-10-CM | POA: Diagnosis not present

## 2021-11-02 DIAGNOSIS — I4892 Unspecified atrial flutter: Secondary | ICD-10-CM | POA: Diagnosis present

## 2021-11-02 DIAGNOSIS — I248 Other forms of acute ischemic heart disease: Secondary | ICD-10-CM | POA: Diagnosis present

## 2021-11-02 DIAGNOSIS — S0003XA Contusion of scalp, initial encounter: Secondary | ICD-10-CM | POA: Diagnosis present

## 2021-11-02 DIAGNOSIS — Z66 Do not resuscitate: Secondary | ICD-10-CM | POA: Diagnosis present

## 2021-11-02 DIAGNOSIS — L8990 Pressure ulcer of unspecified site, unspecified stage: Secondary | ICD-10-CM | POA: Diagnosis present

## 2021-11-02 DIAGNOSIS — I6310 Cerebral infarction due to embolism of unspecified precerebral artery: Secondary | ICD-10-CM | POA: Diagnosis not present

## 2021-11-02 DIAGNOSIS — Z20822 Contact with and (suspected) exposure to covid-19: Secondary | ICD-10-CM | POA: Diagnosis present

## 2021-11-02 DIAGNOSIS — I10 Essential (primary) hypertension: Secondary | ICD-10-CM | POA: Diagnosis present

## 2021-11-02 DIAGNOSIS — R109 Unspecified abdominal pain: Secondary | ICD-10-CM | POA: Diagnosis not present

## 2021-11-02 DIAGNOSIS — Z7189 Other specified counseling: Secondary | ICD-10-CM | POA: Diagnosis not present

## 2021-11-02 DIAGNOSIS — R0602 Shortness of breath: Secondary | ICD-10-CM | POA: Diagnosis not present

## 2021-11-02 DIAGNOSIS — W07XXXA Fall from chair, initial encounter: Secondary | ICD-10-CM | POA: Diagnosis present

## 2021-11-02 DIAGNOSIS — R627 Adult failure to thrive: Secondary | ICD-10-CM | POA: Diagnosis present

## 2021-11-02 DIAGNOSIS — H9190 Unspecified hearing loss, unspecified ear: Secondary | ICD-10-CM | POA: Diagnosis not present

## 2021-11-02 DIAGNOSIS — E785 Hyperlipidemia, unspecified: Secondary | ICD-10-CM | POA: Diagnosis present

## 2021-11-02 HISTORY — DX: Unspecified atrial fibrillation: I48.91

## 2021-11-02 LAB — TSH: TSH: 0.797 u[IU]/mL (ref 0.350–4.500)

## 2021-11-02 MED ORDER — ENOXAPARIN SODIUM 40 MG/0.4ML IJ SOSY
40.0000 mg | PREFILLED_SYRINGE | INTRAMUSCULAR | Status: DC
  Administered 2021-11-02 – 2021-11-08 (×6): 40 mg via SUBCUTANEOUS
  Filled 2021-11-02 (×6): qty 0.4

## 2021-11-02 MED ORDER — ACETAMINOPHEN 325 MG PO TABS
650.0000 mg | ORAL_TABLET | Freq: Four times a day (QID) | ORAL | Status: DC | PRN
  Administered 2021-11-02: 650 mg via ORAL
  Filled 2021-11-02: qty 2

## 2021-11-02 MED ORDER — ONDANSETRON HCL 4 MG PO TABS: 4.0000 mg | ORAL_TABLET | Freq: Four times a day (QID) | ORAL | Status: DC | PRN

## 2021-11-02 MED ORDER — POTASSIUM CHLORIDE CRYS ER 20 MEQ PO TBCR
40.0000 meq | EXTENDED_RELEASE_TABLET | Freq: Once | ORAL | Status: AC
  Administered 2021-11-02: 40 meq via ORAL
  Filled 2021-11-02: qty 2

## 2021-11-02 MED ORDER — METOPROLOL TARTRATE 5 MG/5ML IV SOLN
5.0000 mg | Freq: Four times a day (QID) | INTRAVENOUS | Status: DC | PRN
  Administered 2021-11-02 – 2021-11-06 (×4): 5 mg via INTRAVENOUS
  Filled 2021-11-02 (×5): qty 5

## 2021-11-02 MED ORDER — ACETAMINOPHEN 650 MG RE SUPP
650.0000 mg | Freq: Four times a day (QID) | RECTAL | Status: DC | PRN
Start: 2021-11-02 — End: 2021-11-09

## 2021-11-02 MED ORDER — ONDANSETRON HCL 4 MG/2ML IJ SOLN: 4.0000 mg | Freq: Four times a day (QID) | INTRAMUSCULAR | Status: DC | PRN

## 2021-11-02 MED ORDER — METOPROLOL TARTRATE 25 MG PO TABS
25.0000 mg | ORAL_TABLET | Freq: Two times a day (BID) | ORAL | Status: DC
  Filled 2021-11-02: qty 1

## 2021-11-02 MED ORDER — SODIUM CHLORIDE 0.9% FLUSH
3.0000 mL | Freq: Two times a day (BID) | INTRAVENOUS | Status: DC
  Administered 2021-11-03 – 2021-11-08 (×13): 3 mL via INTRAVENOUS

## 2021-11-02 MED ORDER — HYDRALAZINE HCL 20 MG/ML IJ SOLN
5.0000 mg | INTRAMUSCULAR | Status: DC | PRN
  Administered 2021-11-04 – 2021-11-09 (×5): 5 mg via INTRAVENOUS
  Filled 2021-11-02 (×5): qty 1

## 2021-11-02 MED ORDER — ASPIRIN EC 81 MG PO TBEC
81.0000 mg | DELAYED_RELEASE_TABLET | Freq: Every day | ORAL | Status: DC
  Administered 2021-11-02: 81 mg via ORAL
  Filled 2021-11-02: qty 1

## 2021-11-02 NOTE — ED Notes (Signed)
Patient stated that blood pressure cuff is hurting her arm. Blood pressure cuff readjusted. Patient is confused, at baseline and trying to take blood pressure cuff off.  Patient and daughter educated on keeping blood pressure cuff on for accurate blood pressure readings due to medication that is being administered.  ?

## 2021-11-02 NOTE — ED Notes (Addendum)
Pt increasingly restless and fidgety; pt pulling at lines (nasal cannula, cardiac leads) -- attempted bilat mitts however pt shortly came out of mitts after being placed on -- during restless period have observed increasing HR - will notify ED provider -- DNR BAND APPLIED TO R WRIST DURING CURRENT INTERACTION  ?

## 2021-11-02 NOTE — Consult Note (Addendum)
?Cardiology Consultation:  ? ?Patient ID: [name] ?MRN: [id number]; DOB: 1921/11/19 ? ?Admit date: 11/01/2021 ?Date of Consult: 11/02/2021 ? ?PCP:  [name], L.Marlou Sa, MD ?  ?[facility] Providers ?Cardiologist: New ? ? ?Patient Profile:  ? ?[name] is a 88 y.o. female with a hx of HTN who is being seen 11/02/2021 for the evaluation of atrial fibrillation at the request of Dr. Lorin Brown. ? ?History of Present Illness:  ? ?[name] has been relatively healthy during her lifetime without significant cardiac disease. Family is at beside and states she was mowing the lawn as recently as last week. She has been living alone. She otherwise had not been sleeping well which which they feel has been impacting her memory with confusion recently over the last 2-3 weeks. She has fallen a few times. Her daughter came to visit her yesterday when the patient had fallen out of the chair, was on the floor, and unable to get up. The chair was topped over. She was otherwise not able to provide much history. She was febrile to 101 on arrival. Initial EKG appeared to show a regular narrow complex tachycardia/possible atrial flutter with diffuse ST depression, with followup tracing showing rapid afib with similar ST changes. She was started on IV diltiazem drip though HR was not well controlled. IV Lopressor was given a short while ago with excellent response and HR decrease to the 70s-80s. ? ?hsTroponin 65->79, CK wnl ?Lactic acid 3.1 ?K 3.3, BUN 36, Cr 0.66, calcium 11.0, Tbili 1.7 ?WBC 15.1, Hgb/plt wnl ?Covid/flu negative ?Knee film: degenerative changes ?CT head: no ICH, age indeterminate ischemia (consider MRI), background chronic small vessel changes ?CT chest/abd/pelvis - limited due to motion, ascending TAA to 4.8cm, DDD of spine, aortic atherosclerosis, cholelithasis w/o cholecystitis ?2D echo and TSH pending. ? ?Past Medical History:  ?Diagnosis Date  ? Atrial fibrillation ([place]) 11/02/2021  ? Hypertension   ? Spinal  stenosis   ? ? ?History reviewed. No pertinent surgical history.  ? ?Home Medications:  ?Prior to Admission medications   ?Medication Sig Start Date End Date Taking? Authorizing Provider  ?metoprolol succinate (TOPROL-XL) 50 MG 24 hr tablet Take 50 mg by mouth daily. Take with or immediately following a meal.   Yes [provider]  ?zolpidem (AMBIEN) 5 MG tablet Take 5 mg by mouth at bedtime as needed for sleep. 2.5-5 mg   Yes [provider]  ?amLODipine (NORVASC) 5 MG tablet Take 5 mg by mouth daily. ?Patient not taking: Reported on 11/02/2021    [provider]  ?LORazepam (ATIVAN) 0.5 MG tablet Take 0.5 mg by mouth at bedtime as needed for sleep. ?Patient not taking: Reported on 11/02/2021 09/17/21   [provider]  ? ? ?Inpatient Medications: ?Scheduled Meds: ? aspirin EC  81 mg Oral Daily  ? enoxaparin (LOVENOX) injection  40 mg Subcutaneous Q24H  ? sodium chloride flush  3 mL Intravenous Q12H  ? ?Continuous Infusions: ? diltiazem (CARDIZEM) infusion 15 mg/hr (11/02/21 1500)  ? ?PRN Meds: ?acetaminophen **OR** acetaminophen, hydrALAZINE, metoprolol tartrate, ondansetron **OR** ondansetron (ZOFRAN) IV ? ?Allergies:    ?Allergies  ?Allergen Reactions  ? Codeine Rash  ? ? ?Social History:   ?Social History  ? ?Socioeconomic History  ? Marital status: Widowed  ?  Spouse name: Not on file  ? Number of children: Not on file  ? Years of education: Not on file  ? Highest education level: Not on file  ?Occupational History  ? Occupation: retired  ?  Tobacco Use  ? Smoking status: Never  ? Smokeless tobacco: Never  ?Vaping Use  ? Vaping Use: Never used  ?Substance and Sexual Activity  ? Alcohol use: Not Currently  ? Drug use: Never  ? Sexual activity: Not on file  ?Other Topics Concern  ? Not on file  ?Social History Narrative  ? Not on file  ? ?Social Determinants of Health  ? ?Financial Resource Strain: Not on file  ?Food Insecurity: Not on file  ?Transportation Needs: Not on file   ?Physical Activity: Not on file  ?Stress: Not on file  ?Social Connections: Not on file  ?Intimate Partner Violence: Not on file  ?  ?Family History:   ?Family History  ?Problem Relation Age of Onset  ? Stroke Son   ?  ? ?ROS:  ?Please see the history of present illness.  ?All other ROS reviewed and negative.    ? ?Physical Exam/Data:  ? ?Vitals:  ? 11/02/21 1300 11/02/21 1330 11/02/21 1336 11/02/21 1442  ?BP: (!) 140/94 (!) 141/100 (!) 142/66 (!) 149/116  ?Pulse: (!) 130 76  (!) 146  ?Resp: 16 (!) 21 (!) 23 20  ?Temp:    (!) 97.3 ?F (36.3 ?C)  ?TempSrc:    Axillary  ?SpO2: 97% 95% 96% 94%  ?Weight:    57.9 kg  ?Height:    5\' 4"  (1.626 m)  ? ? ?Intake/Output Summary (Last 24 hours) at 11/02/2021 1733 ?Last data filed at 11/02/2021 1500 ?Gross per 24 hour  ?Intake 647.66 ml  ?Output 100 ml  ?Net 547.66 ml  ? ? ?  11/02/2021  ?  2:42 PM 11/01/2021  ?  4:47 PM 04/01/2020  ?  6:33 PM  ?Last 3 Weights  ?Weight (lbs) 127 lb 10.3 oz 130 lb 160 lb  ?Weight (kg) 57.9 kg 58.968 kg 72.576 kg  ?   ?Body mass index is 21.91 kg/m?.  ?Exam per MD ? ?EKG:  The EKG was personally reviewed and demonstrates:   ?11/01/21: narrow complex tachycardia 154bpm, diffuse ST depression I, II, III, aVF as well as V3-V6, ? atrial flutter ?11/02/21: coarse atrial fibrillation 119bpm, occasional PVCs, continued diffuse ST depression I, II, III, aVF as well as V3-V6 ? ?Telemetry:  Telemetry was personally reviewed and demonstrates:  atrial fibrillation ? ?Relevant CV Studies: ?Carotid US 2010 ?IMPRESSION:  ?   ?Minimal bilateral carotid atherosclerosis.  No hemodynamically  ?significant ICA stenosis on either side.  ? ?Provider: Mariana Single ? ?Laboratory Data: ? ?High Sensitivity Troponin:   ?Recent Labs  ?Lab 11/01/21 ?1639 11/01/21 ?1835  ?TROPONINIHS 65* 79*  ?   ?Chemistry ?Recent Labs  ?Lab 11/01/21 ?1639  ?NA 142  ?K 3.3*  ?CL 105  ?CO2 28  ?GLUCOSE 181*  ?BUN 36*  ?CREATININE 0.66  ?CALCIUM 11.0*  ?GFRNONAA >60  ?ANIONGAP 9  ?  ?Recent Labs  ?Lab  11/01/21 ?1639  ?PROT 7.3  ?ALBUMIN 4.2  ?AST 27  ?ALT 15  ?ALKPHOS 47  ?BILITOT 1.7*  ? ?Lipids No results for input(s): CHOL, TRIG, HDL, LABVLDL, LDLCALC, CHOLHDL in the last 168 hours.  ?Hematology ?Recent Labs  ?Lab 11/01/21 ?1639  ?WBC 15.1*  ?RBC 4.62  ?HGB 13.4  ?HCT 40.7  ?MCV 88.1  ?MCH 29.0  ?MCHC 32.9  ?RDW 15.2  ?PLT 226  ? ?Thyroid No results for input(s): TSH, FREET4 in the last 168 hours.  ?BNPNo results for input(s): BNP, PROBNP in the last 168 hours.  ?DDimer No results for input(s): DDIMER in  the last 168 hours. ? ? ?Radiology/Studies:  ?DG Elbow Complete Left ? ?Result Date: 11/01/2021 ?CLINICAL DATA:  Unwitnessed fall. EXAM: LEFT ELBOW - COMPLETE 3+ VIEW COMPARISON:  None. FINDINGS: There is no evidence of fracture, dislocation, or joint effusion. Minimal degenerative change. Soft tissues are unremarkable. IMPRESSION: No fracture or subluxation of the left elbow. Electronically Signed   By: Keith Rake M.D.   On: 11/01/2021 17:30  ? ?CT Head Wo Contrast ? ?Result Date: 11/01/2021 ?CLINICAL DATA:  88 year old post unwitnessed fall. Head trauma, intracranial arterial injury suspected EXAM: CT HEAD WITHOUT CONTRAST TECHNIQUE: Contiguous axial images were obtained from the base of the skull through the vertex without intravenous contrast. RADIATION DOSE REDUCTION: This exam was performed according to the departmental dose-optimization program which includes automated exposure control, adjustment of the mA and/or kV according to patient size and/or use of iterative reconstruction technique. COMPARISON:  Head CT 04/01/2020 FINDINGS: Brain: No acute intracranial hemorrhage. Low-density with lack of gray-white differentiation in the right frontal lobe, series 2, image 17. This is consistent with age indeterminate ischemia, but new from 2021 exam. Background atrophy and chronic small vessel ischemic change. No subdural or extra-axial collection. Vascular: Atherosclerosis of skullbase vasculature  without hyperdense vessel or abnormal calcification. Skull: No skull fracture. Scattered arachnoid granulations. No focal bone lesion. Sinuses/Orbits: Chronic vessel thickening of right side of sphenoid sinus. No

## 2021-11-02 NOTE — Progress Notes (Signed)
Heart rate is too high for accurate echo at this time. 

## 2021-11-02 NOTE — Consult Note (Signed)
WOC Nurse Consult Note: ?Patient receiving care in [facility] [phone] ?Consult completed remotely after review of record and activation of skin care order set for stage 2 pressure injury.  ?Reason for Consult:pressure ulcer noted by nursing staff ?Stage 2 PI documented on Flow sheet by admitting RN. Skin care order set can be activated for this wound. WOC will not follow but are available to this patient and medical team if further needs arise.  ? ?Renaldo Reel. Katrinka Blazing, MSN, RN, CMSRN, AGCNS, WTA ?Wound Treatment Associate ?Pager [phone]  ? ? ?  ?

## 2021-11-02 NOTE — ED Notes (Signed)
Report attempted to be called. Nurse will call back.  ?

## 2021-11-02 NOTE — Progress Notes (Signed)
Pt arrived to floor around 1430 via stretcher by [facility]. Pt alert and oriented to self only in no acute distress. Pt was rambling and incoherent in speech. Pt's HR elevated in the 120s and 130s. Respirations even and unlabored on room air. Cardizem gtt infusing at 15 mg/hr. Call bell and pt's room phone in reach. Pt encouraged to use call bell for assistance. Bed alarm on. Bed in low position.  ?

## 2021-11-02 NOTE — ED Notes (Signed)
Pt's Daughter was addiment about staff getting Patient up to go to the Bathroom. Staff made Daughter aware that Pt is still currently on Cardizem so she can not come off the Cardiac Monitor. Staff placed a bedside commode beside Pt's bed and assisted Pt to the commode. Pt was very disoriented and was not stable. Pt was able to urinate and wipe herself but Pt could not get herself back in the bed. Staff here had to help lift Pt back into her bed. Pt placed back on a [place]. Daughter aware of difficulties staff had getting the Pt out and back in the bed. Pt still currently trying to pull at her Cardiac wires and lines.   ?

## 2021-11-02 NOTE — Progress Notes (Signed)
?   11/02/21 1442  ?Assess: MEWS Score  ?Temp (!) 97.3 ?F (36.3 ?C)  ?BP (!) 149/116  ?Pulse Rate (!) 146  ?ECG Heart Rate (!) 138  ?Resp 20  ?Level of Consciousness Alert  ?SpO2 94 %  ?O2 Device Room Air  ?Assess: MEWS Score  ?MEWS Temp 0  ?MEWS Systolic 0  ?MEWS Pulse 3  ?MEWS RR 0  ?MEWS LOC 0  ?MEWS Score 3  ?MEWS Score Color Yellow  ?Assess: if the MEWS score is Yellow or Red  ?Were vital signs taken at a resting state? Yes  ?Focused Assessment No change from prior assessment  ?Early Detection of Sepsis Score *See Row Information* High  ?MEWS guidelines implemented *See Row Information* Yes  ?Treat  ?MEWS Interventions Escalated (See documentation below)  ?Pain Scale 0-10  ?Pain Score 0  ?Take Vital Signs  ?Increase Vital Sign Frequency  Yellow: Q 2hr X 2 then Q 4hr X 2, if remains yellow, continue Q 4hrs  ?Escalate  ?MEWS: Escalate Yellow: discuss with charge nurse/RN and consider discussing with provider and RRT  ?Notify: Charge Nurse/RN  ?Name of Charge Nurse/RN Notified Erin RN  ?Date Charge Nurse/RN Notified 11/02/21  ?Time Charge Nurse/RN Notified 1445  ?Notify: Provider  ?Provider Name/Title Dr. Ophelia Charter  ?Date Provider Notified 11/02/21  ?Time Provider Notified 1500  ?Notification Type Page ?(secure chat)  ?Notification Reason Other (Comment) ?(yellows mews, hr elevated)  ?Provider response In department  ?Document  ?Progress note created (see row info) Yes  ? ? ?

## 2021-11-02 NOTE — H&P (Signed)
?History and Physical  ? ? ?Patient: [name] [id number] DOB: 08-13-1921 ?DOA: 11/01/2021 ?DOS: the patient was seen and examined on 11/02/2021 ?PCP: Alroy Dust, L.Marlou Sa, MD  ?Patient coming from: Home - lives alone; NOK: Daughter, Manuella Ghazi, [phone] (unable to reach, voice mail full x2); Rocket, [place], [phone] ? ? ?Chief Complaint: Fall ? ?HPI: [name] is a 88 y.o. female with medical history significant of HTN presenting with a fall.  I spoke with her son.  Her daughter was there when she fell and called 911.  She had fallen recently (a week or so ago) and were making plans to monitor her.  Her heart rate was up.  She has been a bit confused lately.  She was cutting her grass until recently but has gone down quickly in the last few weeks.  No apparent recent illness.  She does have chronic insomnia.  She has not been eating well.  She does not have a h/o afib.  Her son had a recent stroke but she has no indication of stroke per his recall.  He is concerned about myocarditis.  She has been very self-sufficient but they are concerned about her driving.  They think she would like a full treatment course other than DNR.   ? ? ? ?ER Course:  [facility] transfer, per Dr. Alcario Drought: ? ?88 yo F with frequent falls.  In ED found to have sepsis, age indeterminate stroke on CT new since 2021, a.fib (also new diagnosis).  No obvious source for sepsis despite CT AP, CXR, UA.  Daughter declined LP.  Daughter says pt should be DNR (filling out paperwork now).  I suspect the sepsis is the acute issue, the stroke and a.fib unclear duration.  Will admit to [facility] given new stroke. ? ? ? ? ?Review of Systems: unable to review all systems due to the inability of the patient to answer questions. ?Past Medical History:  ?Diagnosis Date  ? Atrial fibrillation ([place]) 11/02/2021  ? Hypertension   ? Spinal stenosis   ? ?History reviewed. No pertinent surgical history. ?Social History:  reports that she has  never smoked. She has never used smokeless tobacco. She reports that she does not currently use alcohol. She reports that she does not use drugs. ? ?No Known Allergies ? ?Family History  ?Problem Relation Age of Onset  ? Stroke Son   ? ? ?Prior to Admission medications   ?Medication Sig Start Date End Date Taking? Authorizing Provider  ?amLODipine (NORVASC) 5 MG tablet Take 5 mg by mouth daily.   Yes [provider]  ?metoprolol succinate (TOPROL-XL) 50 MG 24 hr tablet Take 50 mg by mouth daily. Take with or immediately following a meal.   Yes [provider]  ?zolpidem (AMBIEN) 5 MG tablet Take 5 mg by mouth at bedtime as needed for sleep. 2.5-5 mg   Yes [provider]  ?UNKNOWN TO PATIENT HTN med    [provider]  ? ? ?Physical Exam: ?Vitals:  ? 11/02/21 1300 11/02/21 1330 11/02/21 1336 11/02/21 1442  ?BP: (!) 140/94 (!) 141/100 (!) 142/66 (!) 149/116  ?Pulse: (!) 130 76  (!) 146  ?Resp: 16 (!) 21 (!) 23 20  ?Temp:    (!) 97.3 ?F (36.3 ?C)  ?TempSrc:    Axillary  ?SpO2: 97% 95% 96% 94%  ?Weight:    57.9 kg  ?Height:    5\' 4"  (1.626 m)  ? ?General:  Appears calm and comfortable and  is in NAD ?Eyes:   EOMI, normal lids, iris ?ENT:   very hard of hearing, grossly normal lips & tongue, mmm ?Neck:  no LAD, masses or thyromegaly ?Cardiovascular:  Irregularly irregular with tachycardia, no m/r/g. 1-2+ LE edema.  ?Respiratory:   CTA bilaterally with no wheezes/rales/rhonchi.  Mildly increased respiratory effort. ?Abdomen:  soft, NT, ND ?Skin:  no rash or induration seen on limited exam ?Musculoskeletal:  grossly normal tone BUE/BLE, good ROM, no bony abnormality ?Psychiatric:  blunted mood and affect, speech fluent and appropriate ?Neurologic:  CN 2-12 grossly intact, moves all extremities in coordinated fashion ? ? ?Radiological Exams on Admission: ?Independently reviewed - see discussion in A/P where applicable ? ?DG Elbow Complete Left ? ?Result Date: 11/01/2021 ?CLINICAL DATA:   Unwitnessed fall. EXAM: LEFT ELBOW - COMPLETE 3+ VIEW COMPARISON:  None. FINDINGS: There is no evidence of fracture, dislocation, or joint effusion. Minimal degenerative change. Soft tissues are unremarkable. IMPRESSION: No fracture or subluxation of the left elbow. Electronically Signed   By: Keith Rake M.D.   On: 11/01/2021 17:30  ? ?CT Head Wo Contrast ? ?Result Date: 11/01/2021 ?CLINICAL DATA:  88 year old post unwitnessed fall. Head trauma, intracranial arterial injury suspected EXAM: CT HEAD WITHOUT CONTRAST TECHNIQUE: Contiguous axial images were obtained from the base of the skull through the vertex without intravenous contrast. RADIATION DOSE REDUCTION: This exam was performed according to the departmental dose-optimization program which includes automated exposure control, adjustment of the mA and/or kV according to patient size and/or use of iterative reconstruction technique. COMPARISON:  Head CT 04/01/2020 FINDINGS: Brain: No acute intracranial hemorrhage. Low-density with lack of gray-white differentiation in the right frontal lobe, series 2, image 17. This is consistent with age indeterminate ischemia, but new from 2021 exam. Background atrophy and chronic small vessel ischemic change. No subdural or extra-axial collection. Vascular: Atherosclerosis of skullbase vasculature without hyperdense vessel or abnormal calcification. Skull: No skull fracture. Scattered arachnoid granulations. No focal bone lesion. Sinuses/Orbits: Chronic vessel thickening of right side of sphenoid sinus. No acute findings. Bilateral cataract resection. Other: No confluent scalp hematoma. IMPRESSION: 1. No acute intracranial hemorrhage.  No skull fracture. 2. Low-density with lack of gray-white differentiation in the right frontal lobe is consistent with age indeterminate ischemia, but new from 2021 exam. Consider MRI for further assessment as clinically indicated. 3. Background atrophy and chronic small vessel ischemic  change. Electronically Signed   By: Keith Rake M.D.   On: 11/01/2021 18:07  ? ?CT Cervical Spine Wo Contrast ? ?Result Date: 11/01/2021 ?CLINICAL DATA:  Fall.  Ataxia.  Neck injury EXAM: CT CERVICAL SPINE WITHOUT CONTRAST TECHNIQUE: Multidetector CT imaging of the cervical spine was performed without intravenous contrast. Multiplanar CT image reconstructions were also generated. RADIATION DOSE REDUCTION: This exam was performed according to the departmental dose-optimization program which includes automated exposure control, adjustment of the mA and/or kV according to patient size and/or use of iterative reconstruction technique. COMPARISON:  None. FINDINGS: Alignment: 2-3 mm anterolisthesis C5-6.  Remaining alignment normal Skull base and vertebrae: Negative for cervical fracture Soft tissues and spinal canal: Negative for soft tissue swelling or mass. Disc levels: Diffuse disc and facet degeneration throughout the cervical spine. Mild foraminal narrowing bilaterally multiple levels due to spurring. Mild spinal stenosis C5-6 and C6-7 Upper chest: Lung apices clear bilaterally Other: None IMPRESSION: Moderate to advanced cervical spondylosis Negative for fracture. Electronically Signed   By: Franchot Gallo M.D.   On: 11/01/2021 18:06  ? ?CT CHEST ABDOMEN PELVIS W  CONTRAST ? ?Result Date: 11/01/2021 ?CLINICAL DATA:  Blunt poly trauma, unwitnessed fall. Patient was discovered by her daughter at approximately 13:30. EXAM: CT CHEST, ABDOMEN, AND PELVIS WITH CONTRAST TECHNIQUE: Multidetector CT imaging of the chest, abdomen and pelvis was performed following the standard protocol during bolus administration of intravenous contrast. RADIATION DOSE REDUCTION: This exam was performed according to the departmental dose-optimization program which includes automated exposure control, adjustment of the mA and/or kV according to patient size and/or use of iterative reconstruction technique. CONTRAST:  18mL OMNIPAQUE IOHEXOL  300 MG/ML  SOLN COMPARISON:  None. FINDINGS: CT CHEST FINDINGS Cardiovascular: The heart is enlarged. Coronary artery atherosclerotic calcifications. No pericardial effusion. Aneurysmal dilatation of the asc

## 2021-11-03 ENCOUNTER — Inpatient Hospital Stay (HOSPITAL_COMMUNITY): Payer: Medicare Other

## 2021-11-03 DIAGNOSIS — I4891 Unspecified atrial fibrillation: Secondary | ICD-10-CM

## 2021-11-03 DIAGNOSIS — E870 Hyperosmolality and hypernatremia: Secondary | ICD-10-CM | POA: Diagnosis present

## 2021-11-03 DIAGNOSIS — R778 Other specified abnormalities of plasma proteins: Secondary | ICD-10-CM | POA: Diagnosis not present

## 2021-11-03 DIAGNOSIS — E860 Dehydration: Secondary | ICD-10-CM | POA: Diagnosis present

## 2021-11-03 DIAGNOSIS — E876 Hypokalemia: Secondary | ICD-10-CM | POA: Diagnosis present

## 2021-11-03 LAB — BASIC METABOLIC PANEL
Anion gap: 8 (ref 5–15)
BUN: 23 mg/dL (ref 8–23)
CO2: 28 mmol/L (ref 22–32)
Calcium: 10.1 mg/dL (ref 8.9–10.3)
Chloride: 110 mmol/L (ref 98–111)
Creatinine, Ser: 0.6 mg/dL (ref 0.44–1.00)
GFR, Estimated: >60 mL/min (ref >60)
Glucose, Bld: 124 mg/dL — ABNORMAL HIGH (ref 70–99)
Potassium: 2.9 mmol/L — ABNORMAL LOW (ref 3.5–5.1)
Sodium: 146 mmol/L — ABNORMAL HIGH (ref 135–145)

## 2021-11-03 LAB — LIPID PANEL
Cholesterol: 222 mg/dL — ABNORMAL HIGH (ref 0–200)
HDL: 51 mg/dL (ref >40)
LDL Cholesterol: 143 mg/dL — ABNORMAL HIGH (ref 0–99)
Total CHOL/HDL Ratio: 4.4 RATIO
Triglycerides: 138 mg/dL (ref <150)
VLDL: 28 mg/dL (ref 0–40)

## 2021-11-03 LAB — CBC
HCT: 37.2 % (ref 36.0–46.0)
Hemoglobin: 12.6 g/dL (ref 12.0–15.0)
MCH: 30 pg (ref 26.0–34.0)
MCHC: 33.9 g/dL (ref 30.0–36.0)
MCV: 88.6 fL (ref 80.0–100.0)
Platelets: 235 10*3/uL (ref 150–400)
RBC: 4.2 MIL/uL (ref 3.87–5.11)
RDW: 15.1 % (ref 11.5–15.5)
WBC: 12.2 10*3/uL — ABNORMAL HIGH (ref 4.0–10.5)
nRBC: 0 % (ref 0.0–0.2)

## 2021-11-03 LAB — MAGNESIUM: Magnesium: 1.8 mg/dL (ref 1.7–2.4)

## 2021-11-03 LAB — HEMOGLOBIN A1C
Hgb A1c MFr Bld: 4.9 % (ref 4.8–5.6)
Mean Plasma Glucose: 93.93 mg/dL

## 2021-11-03 LAB — ECHOCARDIOGRAM COMPLETE
Area-P 1/2: 5.31 cm2
Height: 64 in
S' Lateral: 2.1 cm
Weight: 2042.34 oz

## 2021-11-03 LAB — MRSA NEXT GEN BY PCR, NASAL: MRSA by PCR Next Gen: NOT DETECTED

## 2021-11-03 LAB — URINE CULTURE: Culture: <10000 — AB

## 2021-11-03 LAB — PROCALCITONIN: Procalcitonin: <0.1 ng/mL

## 2021-11-03 LAB — C-REACTIVE PROTEIN: CRP: 8.8 mg/dL — ABNORMAL HIGH (ref <1.0)

## 2021-11-03 MED ORDER — DEXTROSE 5 % IV SOLN
INTRAVENOUS | Status: AC
  Filled 2021-11-03 (×3): qty 1000

## 2021-11-03 MED ORDER — DILTIAZEM HCL ER COATED BEADS 180 MG PO CP24: 360.0000 mg | ORAL_CAPSULE | Freq: Every day | ORAL | Status: DC

## 2021-11-03 MED ORDER — METOPROLOL TARTRATE 50 MG PO TABS
50.0000 mg | ORAL_TABLET | Freq: Two times a day (BID) | ORAL | Status: DC
Start: 2021-11-03 — End: 2021-11-03

## 2021-11-03 MED ORDER — DOCUSATE SODIUM 100 MG PO CAPS
200.0000 mg | ORAL_CAPSULE | Freq: Two times a day (BID) | ORAL | Status: DC
Start: 2021-11-03 — End: 2021-11-09
  Administered 2021-11-03 – 2021-11-08 (×11): 200 mg via ORAL
  Filled 2021-11-03 (×11): qty 2

## 2021-11-03 MED ORDER — LORAZEPAM 2 MG/ML IJ SOLN
1.0000 mg | Freq: Once | INTRAMUSCULAR | Status: AC | PRN
  Administered 2021-11-04: 1 mg via INTRAVENOUS
  Filled 2021-11-03: qty 1

## 2021-11-03 MED ORDER — POTASSIUM CHLORIDE CRYS ER 20 MEQ PO TBCR
40.0000 meq | EXTENDED_RELEASE_TABLET | Freq: Once | ORAL | Status: AC
  Administered 2021-11-03: 40 meq via ORAL
  Filled 2021-11-03: qty 2

## 2021-11-03 MED ORDER — METOPROLOL TARTRATE 50 MG PO TABS: 75.0000 mg | ORAL_TABLET | Freq: Two times a day (BID) | ORAL | Status: DC

## 2021-11-03 MED ORDER — BISACODYL 10 MG RE SUPP
10.0000 mg | Freq: Once | RECTAL | Status: DC
  Filled 2021-11-03: qty 1

## 2021-11-03 MED ORDER — SODIUM CHLORIDE 0.9 % IV SOLN
3.0000 g | Freq: Three times a day (TID) | INTRAVENOUS | Status: DC
  Administered 2021-11-03 – 2021-11-09 (×18): 3 g via INTRAVENOUS
  Filled 2021-11-03 (×17): qty 8

## 2021-11-03 MED ORDER — MIRTAZAPINE 15 MG PO TBDP
15.0000 mg | ORAL_TABLET | Freq: Every day | ORAL | Status: DC
  Administered 2021-11-04 – 2021-11-08 (×5): 15 mg via ORAL
  Filled 2021-11-03 (×6): qty 1

## 2021-11-03 MED ORDER — CHLORHEXIDINE GLUCONATE CLOTH 2 % EX PADS
6.0000 | MEDICATED_PAD | Freq: Every day | CUTANEOUS | Status: DC
  Administered 2021-11-03 – 2021-11-08 (×6): 6 via TOPICAL

## 2021-11-03 MED ORDER — DILTIAZEM HCL 60 MG PO TABS: 120.0000 mg | ORAL_TABLET | Freq: Three times a day (TID) | ORAL | Status: DC

## 2021-11-03 MED ORDER — ASPIRIN 300 MG RE SUPP
150.0000 mg | Freq: Every day | RECTAL | Status: DC
  Administered 2021-11-03: 150 mg via RECTAL
  Filled 2021-11-03 (×2): qty 1

## 2021-11-03 MED ORDER — DILTIAZEM HCL-DEXTROSE 125-5 MG/125ML-% IV SOLN (PREMIX)
5.0000 mg/h | INTRAVENOUS | Status: DC
  Administered 2021-11-03: 5 mg/h via INTRAVENOUS
  Administered 2021-11-03: 7.5 mg/h via INTRAVENOUS
  Filled 2021-11-03 (×2): qty 125

## 2021-11-03 MED ORDER — POLYETHYLENE GLYCOL 3350 17 G PO PACK
17.0000 g | PACK | Freq: Every day | ORAL | Status: DC
  Administered 2021-11-04 – 2021-11-08 (×5): 17 g via ORAL
  Filled 2021-11-03 (×6): qty 1

## 2021-11-03 MED ORDER — METOPROLOL TARTRATE 25 MG PO TABS
25.0000 mg | ORAL_TABLET | Freq: Two times a day (BID) | ORAL | Status: DC
  Administered 2021-11-04 – 2021-11-06 (×5): 25 mg via ORAL
  Filled 2021-11-03 (×6): qty 1

## 2021-11-03 NOTE — Progress Notes (Addendum)
?                                  PROGRESS NOTE                                             ?                                                                                                                     ?                                         ? ? Patient Demographics:  ? ? [name], is a 88 y.o. female, DOB - 03/22/1922, HYI:[id number] ? ?Outpatient Primary MD for the patient is Clovis Riley, L.August Saucer, MD    LOS - 1  Admit date - 11/01/2021   ? ?Chief Complaint  ?Patient presents with  ? Fall  ?    ? ?Brief Narrative (HPI from H&P)  - 88 y.o. female with medical history significant of HTN presenting with a fall.  I spoke with her son.  Her daughter was there when she fell and called 911, cording to the family members she has had a few falls over the past several weeks which is unusual for her, she has also had a poor appetite and had 15 to 20 pounds of unintentional weight loss.  She was brought to the ER for this present fall where she was found to have A-fib with RVR, ED did not show any acute changes but there was a age indeterminant frontal lobe low-density area suspicious for infarct. ? ? Subjective:  ? ? [name] today in bed quite confused, moving all 4 extremities but unable to answer questions or cooperate in exam. ? ? Assessment  & Plan :  ? ? ?Multiple falls at home with a fall at the day of admission.  No clear reason, CT head inconclusive stable age-indeterminate infarct in the right frontal lobe, also found to have new onset A-fib RVR.  Currently quite delirious unable to cooperate in exam or onset.  Will try to rule out acute stroke and obtain MRI, unable to take oral medications for now hence aspirin suppository, PT OT and speech eval.  We will also go ahead and check lipid panel and A1c.  Monitor. ? ? ?2.  New onset A-fib RVR.  [place] vas 2 score is greater than 3.  However poor candidate for anticoagulation due to multiple falls and advanced age, seen by  cardiology, on Cardizem drip, goal will be rate controlled.  Once able to take oral medications p.o. Lopressor.  Echocardiogram pending Will monitor. ? ?3.  Urinary retention with abdominal distention.  CT abdomen  pelvis nonacute.  Bladder scan did show more than 350 cc of urine, Foley and Flomax check KUB to rule out any significant stool burden. ? ?4.  Fever at the time of admission.  Of note daughter declined LP at the time of admission, CT abdomen pelvis nonacute, UA unremarkable, at risk for aspiration pneumonia.  For now place her on Unasyn and monitor. ? ?5.  Thoracic aneurysm.  4.8 cm, due to her age not a candidate for repair, beta-blocker and monitor.   ? ?6.  Dehydration with hypernatremia and hypokalemia.  IV D5W and replace potassium, monitor.   ? ?7. Multiple falls, failure to thrive, unintentional weight loss and poor appetite and insomnia.  PT, OT and speech eval.  Remeron for stimulating appetite and help with insomnia.  May require placement. ? ?   ? ?Condition - Extremely Guarded ? ?Family Communication  :  son Peyton Najjar - [phone]  ? ?Code Status :  DNR ? ?Consults  :  Cards ? ?PUD Prophylaxis :   ? ? Procedures  :    ? ?MRI ? ?TTE -  ? ? ?CT head -  1. No acute intracranial hemorrhage.  No skull fracture. 2. Low-density with lack of gray-white differentiation in the right frontal lobe is consistent with age indeterminate ischemia, but new from 2021 exam. Consider MRI for further assessment as clinically indicated. 3. Background atrophy and chronic small vessel ischemic change. ? ?CT -  1.  Limited exam due to patient's motion. 2. Within the limitations no CT evidence of acute visceral or vascular injury in the chest, abdomen or pelvis. 3. Aneurysmal dilatation of the ascending thoracic aorta measuring up to 4.8 cm. 4. Degenerate disc disease of the thoracolumbar spine. No acute fracture of the spine was noted. Evaluation of pelvis is significantly limited due to motion, a follow-up radiograph  would be helpful if clinically warranted. 5. Advanced atherosclerotic disease of abdominal aorta and branch vessels. 6.  Cholelithiasis without evidence of acute cholecystitis.  ? ?   ? ?Disposition Plan  :   ? ?Status is: Inpatient ? ?DVT Prophylaxis  :   ? ?enoxaparin (LOVENOX) injection 40 mg Start: 11/02/21 1615 ? ?Lab Results  ?Component Value Date  ? PLT 235 11/03/2021  ? ? ?Diet :  ?Diet Order   ? ?       ?  Diet Heart Room service appropriate? Yes; Fluid consistency: Thin  Diet effective now       ?  ? ?  ?  ? ?  ?  ? ?Inpatient Medications ? ?Scheduled Meds: ? aspirin  150 mg Rectal Daily  ? Chlorhexidine Gluconate Cloth  6 each Topical Daily  ? enoxaparin (LOVENOX) injection  40 mg Subcutaneous Q24H  ? metoprolol tartrate  25 mg Oral BID  ? mirtazapine  15 mg Oral QHS  ? potassium chloride  40 mEq Oral Once  ? sodium chloride flush  3 mL Intravenous Q12H  ? ?Continuous Infusions: ? dextrose 5 % with KCl/Additives Pediatric custom IV fluid    ? diltiazem (CARDIZEM) infusion 5 mg/hr (11/03/21 0957)  ? ?PRN Meds:.acetaminophen **OR** acetaminophen, hydrALAZINE, LORazepam, metoprolol tartrate, ondansetron **OR** ondansetron (ZOFRAN) IV ? ?Antibiotics  :   ? ?Anti-infectives (From admission, onward)  ? ? Start     Dose/Rate Route Frequency Ordered Stop  ? 11/01/21 1915  vancomycin (VANCOCIN) IVPB 1000 mg/200 mL premix       ? 1,000 mg ?200 mL/hr over 60 Minutes Intravenous  Once  11/01/21 1909 11/01/21 2253  ? 11/01/21 1745  cefTRIAXone (ROCEPHIN) 1 g in sodium chloride 0.9 % 100 mL IVPB       ? 1 g ?200 mL/hr over 30 Minutes Intravenous  Once 11/01/21 1736 11/01/21 1917  ? ?  ? ? ?Time Spent in minutes  30 ? ? ?Susa Raring M.D on 11/03/2021 at 10:21 AM ? ?To page go to [email or web address]  ? ?[facility] -  Office  [phone] ? ?See all Orders from today for further details ? ? ? Objective:  ? ?Vitals:  ? 11/03/21 0054 11/03/21 0400 11/03/21 0700 11/03/21 0755  ?BP:  124/87 140/67 (!) 144/92  ?Pulse: 99 94  (!) 124 61  ?Resp: (!) 25 19 (!) 25 16  ?Temp:  98.3 ?F (36.8 ?C)  97.8 ?F (36.6 ?C)  ?TempSrc:  Axillary  Oral  ?SpO2: 95%  93% 96%  ?Weight:      ?Height:      ? ? ?Wt Readings from Last 3 Encounters:  ?11/02/21 57.9 kg  ?04/01/20 72.6 kg  ? ? ? ?Intake/Output Summary (Last 24 hours) at 11/03/2021 1021 ?Last data filed at 11/03/2021 0818 ?Gross per 24 hour  ?Intake 723.57 ml  ?Output 350 ml  ?Net 373.57 ml  ? ? ? ?Physical Exam ? ?Awake but confused unable to answer questions or follow commands, moving all 4 extremities to painful stimuli ?[place].AT,PERRAL ?Supple Neck, No JVD,   ?Symmetrical Chest wall movement, Good air movement bilaterally, CTAB ?RRR,No Gallops,Rubs or new Murmurs,  ?+ve B.Sounds, Abd distended but appears nontender, ?No Cyanosis, Clubbing or edema  ?  ? ?RN pressure injury documentation: ?Pressure Injury 11/01/21 Sacrum Stage 2 -  Partial thickness loss of dermis presenting as a shallow open injury with a red, pink wound bed without slough. (Active)  ?11/01/21 1905  ?Location: Sacrum  ?Location Orientation:   ?Staging: Stage 2 -  Partial thickness loss of dermis presenting as a shallow open injury with a red, pink wound bed without slough. (reporting nurse advises of stage 2 sacral ulcer at shift change)  ?Wound Description (Comments):   ?Present on Admission: Yes  ?Dressing Type Foam - Lift dressing to assess site every shift 11/03/21 0818  ? ? ? Data Review:  ? ? ?CBC ?Recent Labs  ?Lab 11/01/21 ?1639 11/03/21 ?0702  ?WBC 15.1* 12.2*  ?HGB 13.4 12.6  ?HCT 40.7 37.2  ?PLT 226 235  ?MCV 88.1 88.6  ?MCH 29.0 30.0  ?MCHC 32.9 33.9  ?RDW 15.2 15.1  ?LYMPHSABS 1.2  --   ?MONOABS 1.0  --   ?EOSABS 0.0  --   ?BASOSABS 0.0  --   ? ? ?Electrolytes ?Recent Labs  ?Lab 11/01/21 ?1639 11/01/21 ?1835 11/02/21 ?1814 11/03/21 ?0124 11/03/21 ?4782  ?NA 142  --   --  146*  --   ?K 3.3*  --   --  2.9*  --   ?CL 105  --   --  110  --   ?CO2 28  --   --  28  --   ?GLUCOSE 181*  --   --  124*  --   ?BUN 36*  --   --  23   --   ?CREATININE 0.66  --   --  0.60  --   ?CALCIUM 11.0*  --   --  10.1  --   ?AST 27  --   --   --   --   ?ALT 15  --   --   --   --   ?  ALKPHOS 47  --   --   --   --   ?BILITOT 1.7*  --   --   --

## 2021-11-03 NOTE — Progress Notes (Signed)
Pt refusing all PO meds tonight including metoprolol. Pt strongly encouraged by nursing staff to take her meds but pt continues to refuse stating, "I don't need medicine. Turn the light off and just let me sleep". Pt still on cardizem gtt at this time w/ HR high 60s-80s, BP stable and appears to be comfortable when resting. Dr. Julian Reil notified of pt refusal of medications this evening. ?

## 2021-11-03 NOTE — Evaluation (Signed)
Occupational Therapy Evaluation ?Patient Details ?Name: [name] ?MRN: [id number] ?DOB: 10/21/1921 ?Today's Date: 11/03/2021 ? ? ?History of Present Illness 88 y/o female presented on 11/01/21 after unwitnessed fall. New onset of Afib with RVR in ED. CT with concern for stroke with ischemic changes. Workup still pending. PMH: HTN  ? ?Clinical Impression ?  ?Pt admitted for concerns listed above. PTA pt's chart reported that she was independent with a functional mobility and ADL's. At this time, due to cognition, pt is requiring max A +2 for all ADL's. Pt is highly resistive, despite max encouragement and support. Recommending SNF to maximize pt's safety and OT will follow acutely.   ?   ? ?Recommendations for follow up therapy are one component of a multi-disciplinary discharge planning process, led by the attending physician.  Recommendations may be updated based on patient status, additional functional criteria and insurance authorization.  ? ?Follow Up Recommendations ? Skilled nursing-short term rehab (<3 hours/day)  ?  ?Assistance Recommended at Discharge    ?Patient can return home with the following A lot of help with walking and/or transfers;A lot of help with bathing/dressing/bathroom;Assistance with cooking/housework;Direct supervision/assist for medications management;Direct supervision/assist for financial management;Assist for transportation;Help with stairs or ramp for entrance ? ?  ?Functional Status Assessment ? Patient has had a recent decline in their functional status and demonstrates the ability to make significant improvements in function in a reasonable and predictable amount of time.  ?Equipment Recommendations ? Other (comment) (TBD)  ?  ?Recommendations for Other Services   ? ? ?  ?Precautions / Restrictions Precautions ?Precautions: Fall ?Precaution Comments: mittens ?Restrictions ?Weight Bearing Restrictions: No  ? ?  ? ?Mobility Bed Mobility ?Overal bed mobility: Needs Assistance ?Bed  Mobility: Supine to Sit, Sit to Supine ?  ?  ?Supine to sit: Max assist, +2 for physical assistance, +2 for safety/equipment ?Sit to supine: Min assist, +2 for safety/equipment, +2 for physical assistance ?  ?General bed mobility comments: maxA+2 to achieve EOB due to patient being resistive to movement initially. MinA+2 to return to supine with assist for LE management ?  ? ?Transfers ?  ?  ?  ?  ?  ?  ?  ?  ?  ?General transfer comment: deferred due to patient confusion and not following commands at this time. patient requesting to return to supine ?  ? ?  ?Balance Overall balance assessment: Needs assistance ?Sitting-balance support: No upper extremity supported, Feet supported ?Sitting balance-Leahy Scale: Fair ?Sitting balance - Comments: initially maxA to maintain balance due to resistive behavior progressed to min guard ?  ?  ?  ?  ?  ?  ?  ?  ?  ?  ?  ?  ?  ?  ?  ?   ? ?ADL either performed or assessed with clinical judgement  ? ?ADL Overall ADL's : Needs assistance/impaired ?  ?  ?  ?  ?  ?  ?  ?  ?  ?  ?  ?  ?  ?  ?  ?  ?  ?  ?  ?General ADL Comments: At this time requiring max A +1-2 due to difficulty following commands and/or HOH  ? ? ? ?Vision Baseline Vision/History: 1 Wears glasses ?Ability to See in Adequate Light: 0 Adequate ?Patient Visual Report: No change from baseline ?Vision Assessment?: No apparent visual deficits  ?   ?Perception   ?  ?Praxis   ?  ? ?Pertinent Vitals/Pain Pain Assessment ?Pain Assessment:  PAINAD ?Breathing: normal ?Negative Vocalization: none ?Facial Expression: smiling or inexpressive ?Body Language: relaxed ?Consolability: no need to console ?PAINAD Score: 0 ?Pain Intervention(s): Monitored during session  ? ? ? ?Hand Dominance Right ?  ?Extremity/Trunk Assessment Upper Extremity Assessment ?Upper Extremity Assessment: Generalized weakness ?  ?Lower Extremity Assessment ?Lower Extremity Assessment: Defer to PT evaluation ?  ?Cervical / Trunk Assessment ?Cervical / Trunk  Assessment: Kyphotic ?  ?Communication Communication ?Communication: HOH ?  ?Cognition Arousal/Alertness: Awake/alert ?Behavior During Therapy: Restless ?Overall Cognitive Status: Impaired/Different from baseline ?Area of Impairment: Orientation, Attention, Following commands, Awareness ?  ?  ?  ?  ?  ?  ?  ?  ?Orientation Level: Disoriented to, Place, Time, Situation ?Current Attention Level: Focused ?  ?Following Commands: Follows one step commands inconsistently ?  ?Awareness: Intellectual ?  ?General Comments: Oriented to self only. Follows commands inconsistently but uncertain due to confusion or HOH ?  ?  ?General Comments  VSS on 2L ? ?  ?Exercises   ?  ?Shoulder Instructions    ? ? ?Home Living Family/patient expects to be discharged to:: Private residence ?Living Arrangements: Alone ?  ?  ?  ?  ?  ?  ?  ?  ?  ?  ?  ?  ?  ?  ?  ?Additional Comments: patient unable to provide further information due to confusion ?  ? ?  ?Prior Functioning/Environment Prior Level of Function : Independent/Modified Independent ?  ?  ?  ?  ?  ?  ?  ?  ?  ? ?  ?  ?OT Problem List: Decreased strength;Decreased range of motion;Decreased activity tolerance;Impaired balance (sitting and/or standing);Decreased cognition;Decreased safety awareness;Decreased knowledge of use of DME or AE;Cardiopulmonary status limiting activity ?  ?   ?OT Treatment/Interventions: Self-care/ADL training;Therapeutic exercise;Energy conservation;DME and/or AE instruction;Therapeutic activities;Cognitive remediation/compensation;Patient/family education;Balance training  ?  ?OT Goals(Current goals can be found in the care plan section) Acute Rehab OT Goals ?Patient Stated Goal: To go home ?OT Goal Formulation: Patient unable to participate in goal setting ?Time For Goal Achievement: 11/17/21 ?Potential to Achieve Goals: Fair ?ADL Goals ?Pt Will Perform Eating: with set-up;sitting ?Pt Will Perform Grooming: with set-up;sitting ?Pt Will Perform Lower Body  Bathing: with min guard assist;sitting/lateral leans;sit to/from stand ?Pt Will Perform Lower Body Dressing: with min guard assist;sitting/lateral leans;sit to/from stand ?Pt Will Transfer to Toilet: with mod assist;ambulating ?Pt Will Perform Toileting - Clothing Manipulation and hygiene: with min guard assist;sitting/lateral leans;sit to/from stand  ?OT Frequency: Min 2X/week ?  ? ?Co-evaluation PT/OT/SLP Co-Evaluation/Treatment: Yes ?Reason for Co-Treatment: For patient/therapist safety;Necessary to address cognition/behavior during functional activity ?PT goals addressed during session: Mobility/safety with mobility;Balance ?OT goals addressed during session: ADL's and self-care ?  ? ?  ?AM-PAC OT "6 Clicks" Daily Activity     ?Outcome Measure Help from another person eating meals?: A Lot ?Help from another person taking care of personal grooming?: A Lot ?Help from another person toileting, which includes using toliet, bedpan, or urinal?: A Lot ?Help from another person bathing (including washing, rinsing, drying)?: A Lot ?Help from another person to put on and taking off regular upper body clothing?: A Lot ?Help from another person to put on and taking off regular lower body clothing?: A Lot ?6 Click Score: 12 ?  ?End of Session Equipment Utilized During Treatment: Oxygen ?Nurse Communication: Mobility status ? ?Activity Tolerance: Patient tolerated treatment well ?Patient left: in bed;with call bell/phone within reach;with bed alarm set ? ?OT Visit Diagnosis:  Unsteadiness on feet (R26.81);Other abnormalities of gait and mobility (R26.89);Muscle weakness (generalized) (M62.81);Other symptoms and signs involving cognitive function  ?              ?Time: [id number] ?OT Time Calculation (min): 21 min ?Charges:  OT General Charges ?$OT Visit: 1 Visit ?OT Evaluation ?$OT Eval Moderate Complexity: 1 Mod ? ?Etan Vasudevan H., OTR/L ?Acute Rehabilitation ? ?Ramere Downs Elane Yolanda Bonine ?11/03/2021, 5:27 PM ?

## 2021-11-03 NOTE — Evaluation (Signed)
Clinical/Bedside Swallow Evaluation ?Patient Details  ?Name: [name] ?MRN: [id number] ?Date of Birth: 08/28/1921 ? ?Today's Date: 11/03/2021 ?Time: SLP Start Time (ACUTE ONLY): [id number] SLP Stop Time (ACUTE ONLY): 1100 ?SLP Time Calculation (min) (ACUTE ONLY): 15 min ? ?Past Medical History:  ?Past Medical History:  ?Diagnosis Date  ? Atrial fibrillation ([place]) 11/02/2021  ? Hypertension   ? Spinal stenosis   ? ?Past Surgical History: History reviewed. No pertinent surgical history. ?HPI:  ?[name] is a 88 y.o. female with medical history significant of HTN presenting with a fall.  I spoke with her son.  Her daughter was there when she fell and called 911.  She had fallen recently (a week or so ago) and they were making plans to monitor her.  Her heart rate was up.  She has been a bit confused lately.  She was cutting her grass until recently but has gone downhill quickly in the last few weeks.  No apparent recent illness.  She does have chronic insomnia.  She has not been eating well.  She does not have a h/o afib.  Her son had a recent stroke but she has no indication of stroke per his recall.  He is concerned about myocarditis.  She has been very self-sufficient but they are concerned about her driving.  They think she would like a full treatment course other than DNR.  She was diagnosed with afib with RVR, fever and abnormal head CT.  Chest xray was showing no acute cardiopulmonary disease.  CT of the head was showing No acute intracranial hemorrhage.  No skull fracture.  2. Low-density with lack of gray-white differentiation in the right frontal lobe is consistent with age indeterminate ischemia, but new from 2021 exam. Consider MRI for further assessment as clinically indicated.  3. Background atrophy and chronic small vessel ischemic change.  ?  ?Assessment / Plan / Recommendation  ?Clinical Impression ? Clinical swallowing evaluation was completed using thin liquids via spoon and straw, pureed  material and dry solids.  Her biggest risk factor for aspiration appeared to be her mentation.  She was very confused and unable to follow commands during this evaluation. She is currently wearing mittens.  RN reported that she had been spitting out all presented PO trials.  Cranial nerve exam was unable to be completed due to her mentation.  Mild oral holding noted.  She appeared to have difficulty chewing dry solids with oral residue noted post swallow.  She was unable to open her mouth on command to see if the oral residue was significant and cleared with liquid wash.  Overt s/s of aspiration were not seen post swallow even given straw sips of thin liquids.  Suggest downgrading diet to dyspahgia 3 with thin liquids.  Oral cavity should be checked for residue after meals.  ST will follow for therapuetic diet tolerance. ?SLP Visit Diagnosis: Dysphagia, unspecified (R13.10) ?   ?Aspiration Risk ? Mild aspiration risk  ?  ?Diet Recommendation   Dysphagia 3 with thin liquids ? ?Medication Administration: Crushed with puree  ?  ?Other  Recommendations Oral Care Recommendations: Oral care BID (check for oral residue after intake)   ? ?Recommendations for follow up therapy are one component of a multi-disciplinary discharge planning process, led by the attending physician.  Recommendations may be updated based on patient status, additional functional criteria and insurance authorization. ? ?Follow up Recommendations Other (comment) (TBD)  ? ? ?  ?Assistance Recommended at Discharge  (TBD)  ?  Functional Status Assessment Patient has had a recent decline in their functional status and demonstrates the ability to make significant improvements in function in a reasonable and predictable amount of time.  ?Frequency and Duration min 2x/week  ?2 weeks ?  ?   ? ?Prognosis Prognosis for Safe Diet Advancement: Fair ?Barriers to Reach Goals: Cognitive deficits  ? ?  ? ?Swallow Study   ?General Date of Onset: 11/01/21 ?HPI: [name] is a 88 y.o. female with medical history significant of HTN presenting with a fall.  I spoke with her son.  Her daughter was there when she fell and called 911.  She had fallen recently (a week or so ago) and they were making plans to monitor her.  Her heart rate was up.  She has been a bit confused lately.  She was cutting her grass until recently but has gone downhill quickly in the last few weeks.  No apparent recent illness.  She does have chronic insomnia.  She has not been eating well.  She does not have a h/o afib.  Her son had a recent stroke but she has no indication of stroke per his recall.  He is concerned about myocarditis.  She has been very self-sufficient but they are concerned about her driving.  They think she would like a full treatment course other than DNR.  She was diagnosed with afib with RVR, fever and abnormal head CT.  Chest xray was showing no acute cardiopulmonary disease.  CT of the head was showing No acute intracranial hemorrhage.  No skull fracture.  2. Low-density with lack of gray-white differentiation in the right frontal lobe is consistent with age indeterminate ischemia, but new from 2021 exam. Consider MRI for further assessment as clinically indicated.  3. Background atrophy and chronic small vessel ischemic change. ?Type of Study: Bedside Swallow Evaluation ?Previous Swallow Assessment: None noted at [facility]. ?Diet Prior to this Study: Regular;Thin liquids ?Temperature Spikes Noted: No ?Respiratory Status: Room air ?History of Recent Intubation: No ?Behavior/Cognition: Alert;Doesn't follow directions;Confused ?Oral Cavity Assessment: Other (comment) (unable to assess) ?Oral Care Completed by SLP: No ?Oral Cavity - Dentition:  (Some teeth seen; unable to fully assess) ?Self-Feeding Abilities: Total assist ?Patient Positioning: Upright in bed ?Baseline Vocal Quality: Not observed ?Volitional Cough: Cognitively unable to elicit ?Volitional Swallow: Unable to elicit  ?   ?Oral/Motor/Sensory Function Overall Oral Motor/Sensory Function: Other (comment) (Unable to assess)   ?Ice Chips Ice chips: Not tested   ?Thin Liquid Thin Liquid: Within functional limits ?Presentation: Spoon;Straw  ?  ?Nectar Thick Nectar Thick Liquid: Not tested   ?Honey Thick Honey Thick Liquid: Not tested   ?Puree Puree: Within functional limits ?Presentation: Spoon   ?Solid ? ? ?  Solid: Within functional limits ?Presentation: Spoon  ? ?  ? ?Shelly Flatten, MA, CCC-SLP ?Acute Rehab SLP ?[phone] ? ?Lamar Sprinkles ?11/03/2021,11:20 AM ? ? ? ?

## 2021-11-03 NOTE — Progress Notes (Signed)
Noted some  new orders of po meds from Dr. . Thedore Mins for this patient.   Dr. Thedore Mins notified that she  refused to swallow the pills and spit them out last night. MD said it's ok.   Will get a second IV line and start the fluid ?

## 2021-11-03 NOTE — Progress Notes (Addendum)
Progress Note  Patient Name: [name] Date of Encounter: 11/03/2021  [facility] Cardiologist: None   Subjective   Patient confused this morning. Was refusing to take PO meds. Lost IV access. Currently in soft restraints.  Inpatient Medications    Scheduled Meds:  aspirin EC  81 mg Oral Daily   Chlorhexidine Gluconate Cloth  6 each Topical Daily   diltiazem  360 mg Oral Daily   enoxaparin (LOVENOX) injection  40 mg Subcutaneous Q24H   metoprolol tartrate  50 mg Oral BID   potassium chloride  40 mEq Oral Once   sodium chloride flush  3 mL Intravenous Q12H   Continuous Infusions:  dextrose 5 % with KCl/Additives Pediatric custom IV fluid     PRN Meds: acetaminophen **OR** acetaminophen, hydrALAZINE, metoprolol tartrate, ondansetron **OR** ondansetron (ZOFRAN) IV   Vital Signs    Vitals:   11/03/21 0054 11/03/21 0400 11/03/21 0700 11/03/21 0755  BP:  124/87 140/67 (!) 144/92  Pulse: 99 94 (!) 124 61  Resp: (!) 25 19 (!) 25 16  Temp:  98.3 F (36.8 C)  97.8 F (36.6 C)  TempSrc:  Axillary  Oral  SpO2: 95%  93% 96%  Weight:      Height:        Intake/Output Summary (Last 24 hours) at 11/03/2021 0935 Last data filed at 11/03/2021 0818 Gross per 24 hour  Intake 373.57 ml  Output 350 ml  Net 23.57 ml      11/02/2021    2:42 PM 11/01/2021    4:47 PM 04/01/2020    6:33 PM  Last 3 Weights  Weight (lbs) 127 lb 10.3 oz 130 lb 160 lb  Weight (kg) 57.9 kg 58.968 kg 72.576 kg      Telemetry    Afib with variable rates 60-120s - Personally Reviewed  ECG    Afib with RVR - Personally Reviewed  Physical Exam   GEN: Elderly, confused but comfortable  Neck: No JVD Cardiac: Tachycardic, irregular, no murmurs Respiratory: Clear to auscultation bilaterally. GI: Soft, nontender, non-distended  MS: No edema; No deformity. Neuro:  Confused; intermittently answering questions Psych: Mildly agitated  Labs    High Sensitivity Troponin:   Recent Labs  Lab  11/01/21 1639 11/01/21 1835  TROPONINIHS 65* 79*     Chemistry Recent Labs  Lab 11/01/21 1639 11/03/21 0124 11/03/21 0702  NA 142 146*  --   K 3.3* 2.9*  --   CL 105 110  --   CO2 28 28  --   GLUCOSE 181* 124*  --   BUN 36* 23  --   CREATININE 0.66 0.60  --   CALCIUM 11.0* 10.1  --   MG  --   --  1.8  PROT 7.3  --   --   ALBUMIN 4.2  --   --   AST 27  --   --   ALT 15  --   --   ALKPHOS 47  --   --   BILITOT 1.7*  --   --   GFRNONAA >60 >60  --   ANIONGAP 9 8  --     Lipids No results for input(s): CHOL, TRIG, HDL, LABVLDL, LDLCALC, CHOLHDL in the last 168 hours.  Hematology Recent Labs  Lab 11/01/21 1639 11/03/21 0702  WBC 15.1* 12.2*  RBC 4.62 4.20  HGB 13.4 12.6  HCT 40.7 37.2  MCV 88.1 88.6  MCH 29.0 30.0  MCHC 32.9 33.9  RDW  15.2 15.1  PLT 226 235   Thyroid  Recent Labs  Lab 11/02/21 1814  TSH 0.797    BNPNo results for input(s): BNP, PROBNP in the last 168 hours.  DDimer No results for input(s): DDIMER in the last 168 hours.   Radiology    DG Elbow Complete Left  Result Date: 11/01/2021 CLINICAL DATA:  Unwitnessed fall. EXAM: LEFT ELBOW - COMPLETE 3+ VIEW COMPARISON:  None. FINDINGS: There is no evidence of fracture, dislocation, or joint effusion. Minimal degenerative change. Soft tissues are unremarkable. IMPRESSION: No fracture or subluxation of the left elbow. Electronically Signed   By: Narda Rutherford M.D.   On: 11/01/2021 17:30   CT Head Wo Contrast  Result Date: 11/01/2021 CLINICAL DATA:  88 year old post unwitnessed fall. Head trauma, intracranial arterial injury suspected EXAM: CT HEAD WITHOUT CONTRAST TECHNIQUE: Contiguous axial images were obtained from the base of the skull through the vertex without intravenous contrast. RADIATION DOSE REDUCTION: This exam was performed according to the departmental dose-optimization program which includes automated exposure control, adjustment of the mA and/or kV according to patient size and/or use  of iterative reconstruction technique. COMPARISON:  Head CT 04/01/2020 FINDINGS: Brain: No acute intracranial hemorrhage. Low-density with lack of gray-white differentiation in the right frontal lobe, series 2, image 17. This is consistent with age indeterminate ischemia, but new from 2021 exam. Background atrophy and chronic small vessel ischemic change. No subdural or extra-axial collection. Vascular: Atherosclerosis of skullbase vasculature without hyperdense vessel or abnormal calcification. Skull: No skull fracture. Scattered arachnoid granulations. No focal bone lesion. Sinuses/Orbits: Chronic vessel thickening of right side of sphenoid sinus. No acute findings. Bilateral cataract resection. Other: No confluent scalp hematoma. IMPRESSION: 1. No acute intracranial hemorrhage.  No skull fracture. 2. Low-density with lack of gray-white differentiation in the right frontal lobe is consistent with age indeterminate ischemia, but new from 2021 exam. Consider MRI for further assessment as clinically indicated. 3. Background atrophy and chronic small vessel ischemic change. Electronically Signed   By: Narda Rutherford M.D.   On: 11/01/2021 18:07   CT Cervical Spine Wo Contrast  Result Date: 11/01/2021 CLINICAL DATA:  Fall.  Ataxia.  Neck injury EXAM: CT CERVICAL SPINE WITHOUT CONTRAST TECHNIQUE: Multidetector CT imaging of the cervical spine was performed without intravenous contrast. Multiplanar CT image reconstructions were also generated. RADIATION DOSE REDUCTION: This exam was performed according to the departmental dose-optimization program which includes automated exposure control, adjustment of the mA and/or kV according to patient size and/or use of iterative reconstruction technique. COMPARISON:  None. FINDINGS: Alignment: 2-3 mm anterolisthesis C5-6.  Remaining alignment normal Skull base and vertebrae: Negative for cervical fracture Soft tissues and spinal canal: Negative for soft tissue swelling or  mass. Disc levels: Diffuse disc and facet degeneration throughout the cervical spine. Mild foraminal narrowing bilaterally multiple levels due to spurring. Mild spinal stenosis C5-6 and C6-7 Upper chest: Lung apices clear bilaterally Other: None IMPRESSION: Moderate to advanced cervical spondylosis Negative for fracture. Electronically Signed   By: Marlan Palau M.D.   On: 11/01/2021 18:06   CT CHEST ABDOMEN PELVIS W CONTRAST  Result Date: 11/01/2021 CLINICAL DATA:  Blunt poly trauma, unwitnessed fall. Patient was discovered by her daughter at approximately 13:30. EXAM: CT CHEST, ABDOMEN, AND PELVIS WITH CONTRAST TECHNIQUE: Multidetector CT imaging of the chest, abdomen and pelvis was performed following the standard protocol during bolus administration of intravenous contrast. RADIATION DOSE REDUCTION: This exam was performed according to the departmental dose-optimization program which includes automated  exposure control, adjustment of the mA and/or kV according to patient size and/or use of iterative reconstruction technique. CONTRAST:  75mL OMNIPAQUE IOHEXOL 300 MG/ML  SOLN COMPARISON:  None. FINDINGS: CT CHEST FINDINGS Cardiovascular: The heart is enlarged. Coronary artery atherosclerotic calcifications. No pericardial effusion. Aneurysmal dilatation of the ascending thoracic aorta measuring up to 4.8 cm. No evidence of aortic dissection or acute vascular abnormality. Mediastinum/Nodes: No enlarged mediastinal, hilar, or axillary lymph nodes. Thyroid gland, trachea, and esophagus demonstrate no significant findings. Lungs/Pleura: Lungs are clear without evidence of acute pulmonary process. Mild bibasilar atelectasis. No evidence of pneumonia or pulmonary edema. No pneumothorax. Musculoskeletal: No displaced acute rib fracture. Evaluation of the ribs is somewhat limited due to patient motion. CT ABDOMEN PELVIS FINDINGS Hepatobiliary: No focal liver abnormality is seen. Calcified dependent gallstones, no  gallbladder wall thickening, or biliary dilatation. Pancreas: Advanced pancreatic atrophy. Spleen: No splenic injury or perisplenic hematoma. Adrenals/Urinary Tract: No adrenal hemorrhage or renal injury identified. Multiple bilateral small renal cysts. No evidence of hydronephrosis. Bladder is unremarkable. Stomach/Bowel: Stomach is within normal limits. Evaluation of bowel loops in the pelvis is significantly limited due to motion. No definite evidence of bowel obstruction. Vascular/Lymphatic: Aortic atherosclerosis. No enlarged abdominal or pelvic lymph nodes. Reproductive: Uterus not visualized.  No adnexal mass. Other: No abdominal wall hernia or abnormality. No abdominopelvic ascites. Musculoskeletal: Advanced degenerate disc disease of the thoracolumbar spine. S shaped kyphoscoliosis of the thoracolumbar spine with levoscoliosis at thoracolumbar junction. Evaluation of the pelvis is significantly limited due to patient motion. IMPRESSION: 1.  Limited exam due to patient's motion. 2. Within the limitations no CT evidence of acute visceral or vascular injury in the chest, abdomen or pelvis. 3. Aneurysmal dilatation of the ascending thoracic aorta measuring up to 4.8 cm. 4. Degenerate disc disease of the thoracolumbar spine. No acute fracture of the spine was noted. Evaluation of pelvis is significantly limited due to motion, a follow-up radiograph would be helpful if clinically warranted. 5. Advanced atherosclerotic disease of abdominal aorta and branch vessels. 6.  Cholelithiasis without evidence of acute cholecystitis. Electronically Signed   By: Larose Hires D.O.   On: 11/01/2021 18:22   DG Chest Port 1 View  Result Date: 11/03/2021 CLINICAL DATA:  88 year old female with shortness of breath. Atrial fibrillation. Hypertension. EXAM: PORTABLE CHEST 1 VIEW COMPARISON:  CT Chest, Abdomen, and Pelvis 11/01/2021 and earlier. FINDINGS: Portable AP semi upright view at 0706 hours. Mildly rotated to the left.  Lung volumes and mediastinal contours within normal limits. Calcified aortic atherosclerosis. Allowing for portable technique the lungs are clear. No pneumothorax or pleural effusion. Chronic distal left clavicle deformity. Stable visualized osseous structures. Negative visible bowel gas. IMPRESSION: No acute cardiopulmonary abnormality. Electronically Signed   By: Odessa Fleming M.D.   On: 11/03/2021 07:34   DG Knee Complete 4 Views Left  Result Date: 11/01/2021 CLINICAL DATA:  Fall. EXAM: LEFT KNEE - COMPLETE 4+ VIEW COMPARISON:  None. FINDINGS: No evidence of fracture, dislocation, or joint effusion. There is mild medial compartment joint space narrowing and osteophyte formation. There is lateral compartment chondrocalcinosis, likely degenerative. Peripheral vascular calcifications are present. Soft tissues are otherwise unremarkable. IMPRESSION: 1. No acute bony abnormality. 2. Mild degenerative changes. Electronically Signed   By: Darliss Cheney M.D.   On: 11/01/2021 17:23   DG Knee Complete 4 Views Right  Result Date: 11/01/2021 CLINICAL DATA:  Fall. EXAM: RIGHT KNEE - COMPLETE 4+ VIEW COMPARISON:  None. FINDINGS: No evidence of fracture, dislocation, or joint  effusion. There is mild medial compartment joint space narrowing and osteophyte formation. There is lateral compartment chondrocalcinosis, likely degenerative. Peripheral vascular calcifications are present. IMPRESSION: 1. No acute bony abnormality. 2. Mild degenerative changes. Electronically Signed   By: Darliss Cheney M.D.   On: 11/01/2021 17:25    Cardiac Studies   TTE pending  Patient Profile     88 y.o. female with history of HTN who presented after a fall found to be in new aflutter for which Cardiology has been consulted.   Assessment & Plan    #Newly Diagnosed Afib with RVR: CHADs-vasc 5. Unclear trigger for flutter. Eleavated WBC and documented fever in ER but no overt signs of infection. CT with possible ischemic stroke. Was on IV  dilt but she has been refusing to take medications and pulled her IV. Will trial PO dilt now. AC deferred due to concern for possible stroke. Given advanced age and history of falls, likely not a good long-term AC candidate. Currently, pursuing rate control strategy. -Will trial dilt 360mg  daily -If refuses PO, can change back to dilt gtt -Continue metop 50mg  PO BID -Pursuing rate control strategy -Likely not a good candidate for long-term AC given advanced age and frequent falls -Follow-up TTE  #Ascending aortic aneurysm: Measures 48mm on CT. Not a candidate for future repair if dilates further due to advanced age. Does not need further monitoring as would not change management. Will continue with blood pressure control and conservative management.   #Elevated troponin: Likely demand in the setting of Afib with RVR. Not on heparin due to concern for stroke and low suspicion for ACS. TTE pending.       For questions or updates, please contact [facility] Please consult [email or web address] for contact info under        Signed, Meriam Sprague, MD  11/03/2021, 9:35 AM

## 2021-11-03 NOTE — Progress Notes (Addendum)
Pharmacy Antibiotic Note ? ?[name] is a 88 y.o. female admitted on 11/01/2021 with aspiration pneumonia.  Pharmacy has been consulted for Unasyn dosing. WBC 15 and temp 101 on admit. Has had multiple falls, failure to thrive, unintentional weight loss, poor appetite and is at risk of aspiration. Given her status and likely low muscle mass, she might have a falsely elevated CrCl and will start with a longer dosing interval. ? ?Plan: ?Start Unasyn IV 3 g q8h ?Monitor s/sx of improvement, renal function, and cultures. ?Narrow antibiotics as able ? ?Height: 5\' 4"  (162.6 cm) ?Weight: 57.9 kg (127 lb 10.3 oz) ?IBW/kg (Calculated) : 54.7 ? ?Temp (24hrs), Avg:97.9 ?F (36.6 ?C), Min:97.3 ?F (36.3 ?C), Max:98.4 ?F (36.9 ?C) ? ?Recent Labs  ?Lab 11/01/21 ?1639 11/01/21 ?1835 11/03/21 ?0124 11/03/21 ?11/05/21  ?WBC 15.1*  --   --  12.2*  ?CREATININE 0.66  --  0.60  --   ?LATICACIDVEN 3.1* 1.5  --   --   ?  ?Estimated Creatinine Clearance: 33.9 mL/min (by C-G formula based on SCr of 0.6 mg/dL).   ? ?Allergies  ?Allergen Reactions  ? Codeine Rash  ? ? ?Antimicrobials this admission: ?Unasyn 4/15 >>  ?Vancomycin 4/13 x 1 ?CTX 4/13 x 1 ? ?Dose adjustments this admission: ?None. ? ?Microbiology results: ?4/15 BCx: NGTD ?4/15 UCx: insignificant growth  ?4/15 MRSA PCR: negative ? ? ?Thank you for allowing pharmacy to be a part of this patient?s care. ? ?5/15, PharmD ?PGY1 Pharmacy Resident ? ?Please check AMION for all [facility] pharmacy phone numbers ?After 10:00 PM call main pharmacy [phone] ? ? ?

## 2021-11-03 NOTE — Progress Notes (Signed)
?  Echocardiogram ?2D Echocardiogram has been performed. ? ?[name] ?11/03/2021, 12:49 PM ?

## 2021-11-03 NOTE — Evaluation (Signed)
Physical Therapy Evaluation ?Patient Details ?Name: [name] ?MRN: [id number] ?DOB: June 03, 1922 ?Today's Date: 11/03/2021 ? ?History of Present Illness ? 88 y/o female presented on 11/01/21 after unwitnessed fall. New onset of Afib with RVR in ED. CT with concern for stroke with ischemic changes. Workup still pending. PMH: HTN  ?Clinical Impression ? Patient admitted with the above. Patient presents with impaired cognition, decreased activity tolerance, generalized weakness, and impaired balance. Patient required maxA+2 for supine>sit due to resistive behaviors but once sitting EOB >1 minute patient progressed to sitting with min guard. Patient oriented to self and difficult to reorient due to [facility]. No family present to provide PLOF but based on chart, patient was independent and still able to perform yard work recently. Patient will benefit from skilled PT services during acute stay to address listed deficits. Recommend SNF at this time to maximize functional mobility and safety prior to returning home.  ?   ? ?Recommendations for follow up therapy are one component of a multi-disciplinary discharge planning process, led by the attending physician.  Recommendations may be updated based on patient status, additional functional criteria and insurance authorization. ? ?Follow Up Recommendations Skilled nursing-short term rehab (<3 hours/day) ? ?  ?Assistance Recommended at Discharge Frequent or constant Supervision/Assistance  ?Patient can return home with the following ? Two people to help with walking and/or transfers;Two people to help with bathing/dressing/bathroom;Assistance with cooking/housework;Direct supervision/assist for medications management;Assist for transportation;Direct supervision/assist for financial management;Help with stairs or ramp for entrance ? ?  ?Equipment Recommendations Other (comment) (TBD)  ?Recommendations for Other Services ?    ?  ?Functional Status Assessment Patient has had a recent  decline in their functional status and demonstrates the ability to make significant improvements in function in a reasonable and predictable amount of time.  ? ?  ?Precautions / Restrictions Precautions ?Precautions: Fall ?Precaution Comments: mittens  ? ?  ? ?Mobility ? Bed Mobility ?Overal bed mobility: Needs Assistance ?Bed Mobility: Supine to Sit, Sit to Supine ?  ?  ?Supine to sit: Max assist, +2 for physical assistance, +2 for safety/equipment ?Sit to supine: Min assist, +2 for safety/equipment, +2 for physical assistance ?  ?General bed mobility comments: maxA+2 to achieve EOB due to patient being resistive to movement initially. MinA+2 to return to supine with assist for LE management ?  ? ?Transfers ?  ?  ?  ?  ?  ?  ?  ?  ?  ?General transfer comment: deferred due to patient confusion and not following commands at this time. patient requesting to return to supine ?  ? ?Ambulation/Gait ?  ?  ?  ?  ?  ?  ?  ?  ? ?Stairs ?  ?  ?  ?  ?  ? ?Wheelchair Mobility ?  ? ?Modified Rankin (Stroke Patients Only) ?  ? ?  ? ?Balance Overall balance assessment: Needs assistance ?Sitting-balance support: No upper extremity supported, Feet supported ?Sitting balance-Leahy Scale: Fair ?Sitting balance - Comments: initially maxA to maintain balance due to resistive behavior progressed to min guard ?  ?  ?  ?  ?  ?  ?  ?  ?  ?  ?  ?  ?  ?  ?  ?   ? ? ? ?Pertinent Vitals/Pain Pain Assessment ?Pain Assessment: PAINAD ?Breathing: normal ?Negative Vocalization: none ?Facial Expression: smiling or inexpressive ?Body Language: relaxed ?Consolability: no need to console ?PAINAD Score: 0 ?Pain Intervention(s): Monitored during session  ? ? ?Home Living  Family/patient expects to be discharged to:: Private residence ?Living Arrangements: Alone ?  ?  ?  ?  ?  ?  ?  ?  ?Additional Comments: patient unable to provide further information due to confusion  ?  ?Prior Function Prior Level of Function : Independent/Modified Independent ?  ?  ?   ?  ?  ?  ?  ?  ?  ? ? ?Hand Dominance  ?   ? ?  ?Extremity/Trunk Assessment  ? Upper Extremity Assessment ?Upper Extremity Assessment: Defer to OT evaluation ?  ? ?Lower Extremity Assessment ?Lower Extremity Assessment: Generalized weakness ?  ? ?Cervical / Trunk Assessment ?Cervical / Trunk Assessment: Kyphotic  ?Communication  ? Communication: HOH  ?Cognition Arousal/Alertness: Awake/alert ?Behavior During Therapy: Restless ?Overall Cognitive Status: Impaired/Different from baseline ?Area of Impairment: Orientation, Attention, Following commands, Awareness ?  ?  ?  ?  ?  ?  ?  ?  ?Orientation Level: Disoriented to, Place, Time, Situation ?Current Attention Level: Focused ?  ?Following Commands: Follows one step commands inconsistently ?  ?Awareness: Intellectual ?  ?General Comments: Oriented to self only. Follows commands inconsistently but uncertain due to confusion or HOH ?  ?  ? ?  ?General Comments General comments (skin integrity, edema, etc.): VSS on 2L O2 [place] ? ?  ?Exercises    ? ?Assessment/Plan  ?  ?PT Assessment Patient needs continued PT services  ?PT Problem List Decreased strength;Decreased activity tolerance;Decreased balance;Decreased mobility;Decreased cognition;Decreased knowledge of use of DME;Decreased safety awareness;Decreased knowledge of precautions;Cardiopulmonary status limiting activity ? ?   ?  ?PT Treatment Interventions DME instruction;Gait training;Functional mobility training;Therapeutic activities;Therapeutic exercise;Balance training;Patient/family education   ? ?PT Goals (Current goals can be found in the Care Plan section)  ?Acute Rehab PT Goals ?Patient Stated Goal: did not state ?PT Goal Formulation: Patient unable to participate in goal setting ?Time For Goal Achievement: 11/17/21 ?Potential to Achieve Goals: Fair ? ?  ?Frequency Min 2X/week ?  ? ? ?Co-evaluation PT/OT/SLP Co-Evaluation/Treatment: Yes ?Reason for Co-Treatment: For patient/therapist safety;Necessary to address  cognition/behavior during functional activity ?PT goals addressed during session: Mobility/safety with mobility;Balance ?  ?  ? ? ?  ?AM-PAC PT "6 Clicks" Mobility  ?Outcome Measure Help needed turning from your back to your side while in a flat bed without using bedrails?: Total ?Help needed moving from lying on your back to sitting on the side of a flat bed without using bedrails?: Total ?Help needed moving to and from a bed to a chair (including a wheelchair)?: Total ?Help needed standing up from a chair using your arms (e.g., wheelchair or bedside chair)?: Total ?Help needed to walk in hospital room?: Total ?Help needed climbing 3-5 steps with a railing? : Total ?6 Click Score: 6 ? ?  ?End of Session Equipment Utilized During Treatment: Oxygen ?Activity Tolerance: Patient tolerated treatment well ?Patient left: in bed;with call bell/phone within reach;with bed alarm set;with restraints reapplied ?Nurse Communication: Mobility status ?PT Visit Diagnosis: Unsteadiness on feet (R26.81);Muscle weakness (generalized) (M62.81);History of falling (Z91.81) ?  ? ?Time: [id number] ?PT Time Calculation (min) (ACUTE ONLY): 19 min ? ? ?Charges:   PT Evaluation ?$PT Eval Moderate Complexity: 1 Mod ?  ?  ?   ? ? ?Rhegan Trunnell A. Gilford Rile, PT, DPT ?Acute Rehabilitation Services ?Pager [phone] ?Office [phone] ? ? ?Uilani Sanville A Alistar Mcenery ?11/03/2021, 4:48 PM ? ?

## 2021-11-04 ENCOUNTER — Inpatient Hospital Stay (HOSPITAL_COMMUNITY): Payer: Medicare Other

## 2021-11-04 DIAGNOSIS — I7121 Aneurysm of the ascending aorta, without rupture: Secondary | ICD-10-CM

## 2021-11-04 DIAGNOSIS — I10 Essential (primary) hypertension: Secondary | ICD-10-CM | POA: Diagnosis not present

## 2021-11-04 DIAGNOSIS — R778 Other specified abnormalities of plasma proteins: Secondary | ICD-10-CM | POA: Diagnosis not present

## 2021-11-04 DIAGNOSIS — I4891 Unspecified atrial fibrillation: Secondary | ICD-10-CM | POA: Diagnosis not present

## 2021-11-04 LAB — CBC WITH DIFFERENTIAL/PLATELET
Abs Immature Granulocytes: 0.05 10*3/uL (ref 0.00–0.07)
Basophils Absolute: 0 10*3/uL (ref 0.0–0.1)
Basophils Relative: 0 %
Eosinophils Absolute: 0 10*3/uL (ref 0.0–0.5)
Eosinophils Relative: 0 %
HCT: 34.4 % — ABNORMAL LOW (ref 36.0–46.0)
Hemoglobin: 11.5 g/dL — ABNORMAL LOW (ref 12.0–15.0)
Immature Granulocytes: 1 %
Lymphocytes Relative: 18 %
Lymphs Abs: 1.7 10*3/uL (ref 0.7–4.0)
MCH: 29.9 pg (ref 26.0–34.0)
MCHC: 33.4 g/dL (ref 30.0–36.0)
MCV: 89.4 fL (ref 80.0–100.0)
Monocytes Absolute: 0.9 10*3/uL (ref 0.1–1.0)
Monocytes Relative: 10 %
Neutro Abs: 6.7 10*3/uL (ref 1.7–7.7)
Neutrophils Relative %: 71 %
Platelets: 227 10*3/uL (ref 150–400)
RBC: 3.85 MIL/uL — ABNORMAL LOW (ref 3.87–5.11)
RDW: 15.3 % (ref 11.5–15.5)
WBC: 9.4 10*3/uL (ref 4.0–10.5)
nRBC: 0 % (ref 0.0–0.2)

## 2021-11-04 LAB — PROCALCITONIN: Procalcitonin: <0.1 ng/mL

## 2021-11-04 LAB — C-REACTIVE PROTEIN: CRP: 8.7 mg/dL — ABNORMAL HIGH (ref <1.0)

## 2021-11-04 LAB — MAGNESIUM: Magnesium: 1.9 mg/dL (ref 1.7–2.4)

## 2021-11-04 LAB — BRAIN NATRIURETIC PEPTIDE: B Natriuretic Peptide: 427 pg/mL — ABNORMAL HIGH (ref 0.0–100.0)

## 2021-11-04 MED ORDER — DILTIAZEM HCL ER COATED BEADS 240 MG PO CP24: 240.0000 mg | ORAL_CAPSULE | Freq: Every day | ORAL | Status: DC

## 2021-11-04 MED ORDER — LOSARTAN POTASSIUM 50 MG PO TABS
50.0000 mg | ORAL_TABLET | Freq: Every day | ORAL | Status: DC
  Administered 2021-11-04 – 2021-11-05 (×2): 50 mg via ORAL
  Filled 2021-11-04 (×2): qty 1

## 2021-11-04 NOTE — Progress Notes (Signed)
? ?Progress Note ? ?Patient Name: [name] ?Date of Encounter: 11/04/2021 ? ?[facility] Cardiologist: None  ? ?Subjective  ? ?More awake and conversant today. Denies chest pain or SOB.  ? ?Converted to NSR; HR currently in 70s. ?TTE limited as patient did not want study performed. LVEF 60-65%. Has severe AI, severe MAC, and moderate-to-severe dilation of the ascending aorta ? ?Inpatient Medications  ?  ?Scheduled Meds: ? aspirin  150 mg Rectal Daily  ? bisacodyl  10 mg Rectal Once  ? Chlorhexidine Gluconate Cloth  6 each Topical Daily  ? docusate sodium  200 mg Oral BID  ? enoxaparin (LOVENOX) injection  40 mg Subcutaneous Q24H  ? losartan  50 mg Oral Daily  ? metoprolol tartrate  25 mg Oral BID  ? mirtazapine  15 mg Oral QHS  ? polyethylene glycol  17 g Oral Daily  ? sodium chloride flush  3 mL Intravenous Q12H  ? ?Continuous Infusions: ? ampicillin-sulbactam (UNASYN) IV 3 g (11/04/21 1107)  ? ?PRN Meds: ?acetaminophen **OR** acetaminophen, hydrALAZINE, LORazepam, metoprolol tartrate, ondansetron **OR** ondansetron (ZOFRAN) IV  ? ?Vital Signs  ?  ?Vitals:  ? 11/03/21 1700 11/03/21 1948 11/03/21 2329 11/04/21 0334  ?BP: (!) 171/65 (!) 160/63 (!) 143/58 (!) 152/66  ?Pulse: 69 68 70 74  ?Resp:  _0 ?Temp: 97.8 ?F (36.6 ?C) 97.6 ?F (36.4 ?C) 97.9 ?F (36.6 ?C) 97.8 ?F (36.6 ?C)  ?TempSrc: Oral Oral Axillary Axillary  ?SpO2: 100% 99% 97% 95%  ?Weight:      ?Height:      ? ? ?Intake/Output Summary (Last 24 hours) at 11/04/2021 1131 ?Last data filed at 11/04/2021 1000 ?Gross per 24 hour  ?Intake 695.2 ml  ?Output 550 ml  ?Net 145.2 ml  ? ? ?  11/02/2021  ?  2:42 PM 11/01/2021  ?  4:47 PM 04/01/2020  ?  6:33 PM  ?Last 3 Weights  ?Weight (lbs) 127 lb 10.3 oz 130 lb 160 lb  ?Weight (kg) 57.9 kg 58.968 kg 72.576 kg  ?   ? ?Telemetry  ?  ?Converted to NSR with HR 70s; occasional PVC - Personally Reviewed ? ?ECG  ?  ?No new tracing - Personally Reviewed ? ?Physical Exam  ? ?GEN: Elderly, hard of hearing, more awake  and conversant  ?Neck: No JVD ?Cardiac: RR, soft diastolic murmur ?Respiratory: Clear to auscultation bilaterally. ?GI: Soft, nontender, non-distended  ?MS: No edema; No deformity. ?Neuro:  Hard of hearing but more alert and appropriate today.  ?Psych: Normal affect ? ?Labs  ?  ?High Sensitivity Troponin:   ?Recent Labs  ?Lab 11/01/21 ?1639 11/01/21 ?1835  ?TROPONINIHS 65* 79*  ?   ?Chemistry ?Recent Labs  ?Lab 11/01/21 ?1639 11/03/21 ?0124 11/03/21 ?3009 11/04/21 ?0140  ?NA 142 146*  --   --   ?K 3.3* 2.9*  --   --   ?CL 105 110  --   --   ?CO2 28 28  --   --   ?GLUCOSE 181* 124*  --   --   ?BUN 36* 23  --   --   ?CREATININE 0.66 0.60  --   --   ?CALCIUM 11.0* 10.1  --   --   ?MG  --   --  1.8 1.9  ?PROT 7.3  --   --   --   ?ALBUMIN 4.2  --   --   --   ?AST 27  --   --   --   ?  ALT 15  --   --   --   ?ALKPHOS 47  --   --   --   ?BILITOT 1.7*  --   --   --   ?GFRNONAA >60 >60  --   --   ?ANIONGAP 9 8  --   --   ?  ?Lipids  ?Recent Labs  ?Lab 11/03/21 ?1032  ?CHOL 222*  ?TRIG 138  ?HDL 51  ?LDLCALC 143*  ?CHOLHDL 4.4  ?  ?Hematology ?Recent Labs  ?Lab 11/01/21 ?1639 11/03/21 ?0702 11/04/21 ?0140  ?WBC 15.1* 12.2* 9.4  ?RBC 4.62 4.20 3.85*  ?HGB 13.4 12.6 11.5*  ?HCT 40.7 37.2 34.4*  ?MCV 88.1 88.6 89.4  ?MCH 29.0 30.0 29.9  ?MCHC 32.9 33.9 33.4  ?RDW 15.2 15.1 15.3  ?PLT 226 235 227  ? ?Thyroid  ?Recent Labs  ?Lab 11/02/21 ?1814  ?TSH 0.797  ?  ?BNP ?Recent Labs  ?Lab 11/04/21 ?0140  ?BNP 427.0*  ?  ?DDimer No results for input(s): DDIMER in the last 168 hours.  ? ?Radiology  ?  ?DG Chest Port 1 View ? ?Result Date: 11/03/2021 ?CLINICAL DATA:  88 year old female with shortness of breath. Atrial fibrillation. Hypertension. EXAM: PORTABLE CHEST 1 VIEW COMPARISON:  CT Chest, Abdomen, and Pelvis 11/01/2021 and earlier. FINDINGS: Portable AP semi upright view at 0706 hours. Mildly rotated to the left. Lung volumes and mediastinal contours within normal limits. Calcified aortic atherosclerosis. Allowing for portable  technique the lungs are clear. No pneumothorax or pleural effusion. Chronic distal left clavicle deformity. Stable visualized osseous structures. Negative visible bowel gas. IMPRESSION: No acute cardiopulmonary abnormality. Electronically Signed   By: Genevie Ann M.D.   On: 11/03/2021 07:34  ? ?DG Abd Portable 1V ? ?Result Date: 11/03/2021 ?CLINICAL DATA:  Constipation.  Abdominal swelling and tightness. EXAM: PORTABLE ABDOMEN - 1 VIEW COMPARISON:  CT 11/01/2021. FINDINGS: Interval increase in gaseous distension of the colon with persistent moderate stool burden. No signs of pneumoperitoneum. IMPRESSION: Interval increase in gaseous distension of the colon with persistent moderate stool burden. Electronically Signed   By: Kerby Moors M.D.   On: 11/03/2021 11:05  ? ?ECHOCARDIOGRAM COMPLETE ? ?Result Date: 11/03/2021 ?   ECHOCARDIOGRAM REPORT   Patient Name:   [name] Date of Exam: 11/03/2021 Medical Rec #:  [id number]       Height:       64.0 in Accession #:    [id number]      Weight:       127.6 lb Date of Birth:  Mar 05, 1922        BSA:          1.616 m? Patient Age:    88 years        BP:           162/58 mmHg Patient Gender: F               HR:           69 bpm. Exam Location:  Inpatient Procedure: 2D Echo, Cardiac Doppler and Color Doppler Indications:     Atrial Fibrillation  History:         Patient has no prior history of Echocardiogram examinations.                  Arrythmias:Tachycardia; Signs/Symptoms:Altered Mental Status.  Sonographer:     Merrie Roof RDCS Referring Phys:  [id number] Karmen Bongo Diagnosing Phys: Gwyndolyn Kaufman MD  Sonographer Comments: Image acquisition challenging due  to uncooperative patient. The patient was fidgeting and trying to remove mittens and not cooperative with the echo at all when I had to stop imaging. IMPRESSIONS  1. Limited echo due to uncooperative patient.  2. Left ventricular ejection fraction, by estimation, is 60 to 65%. The left ventricle has normal function. The  left ventricle has no regional wall motion abnormalities. There is severe concentric left ventricular hypertrophy. Diastolic function indeterminant due to severe MAC.  3. Right ventricular systolic function is normal. The right ventricular size is normal.  4. Left atrial size was moderately dilated.  5. The mitral valve is abnormal. Severe leaflet thickening and calcification. Trivial mitral valve regurgitation. Severe mitral annular calcification.  6. The aortic valve is tricuspid. There is moderate calcification of the aortic valve. There is moderate thickening of the aortic valve. Aortic valve regurgitation is severe.  7. Aortic dilatation noted. There is moderate dilatation of the ascending aorta, measuring 48 mm. Comparison(s): No prior Echocardiogram. FINDINGS  Left Ventricle: Left ventricular ejection fraction, by estimation, is 60 to 65%. The left ventricle has normal function. The left ventricle has no regional wall motion abnormalities. The left ventricular internal cavity size was normal in size. There is  severe concentric left ventricular hypertrophy. Diastolic function indeterminant due to severe MAC. Right Ventricle: The right ventricular size is normal. No increase in right ventricular wall thickness. Right ventricular systolic function is normal. Left Atrium: Left atrial size was moderately dilated. Right Atrium: Right atrial size was normal in size. Pericardium: There is no evidence of pericardial effusion. Mitral Valve: The mitral valve is abnormal. There is severe thickening of the mitral valve leaflet(s). There is severe calcification of the mitral valve leaflet(s). Severe mitral annular calcification. Trivial mitral valve regurgitation. Tricuspid Valve: The tricuspid valve is normal in structure. Tricuspid valve regurgitation is mild. Aortic Valve: The aortic valve is tricuspid. There is moderate calcification of the aortic valve. There is moderate thickening of the aortic valve. Aortic valve  regurgitation is severe. Pulmonic Valve: The pulmonic valve was normal in structure. Pulmonic valve regurgitation is trivial. Aorta: Aortic dilatation noted. There is moderate dilatation of the ascending aorta,

## 2021-11-04 NOTE — TOC Initial Note (Signed)
Transition of Care (TOC) - Initial/Assessment Note  ? ? ?Patient Details  ?Name: [name] ?MRN: [id number] ?Date of Birth: Sep 04, 1921 ? ?Transition of Care [facility]) CM/SW Contact:    ?Lorri Frederick, LCSW ?Phone Number: ?11/04/2021, 11:28 AM ? ?Clinical Narrative:   Pt oriented x1, CSW spoke with son Peyton Najjar by phone, discussed SNF recommendation and he is in agreement with this plan.  Choice document placed in pt room.  Permission given to send out referral in hub.  Pt has had one covid vaccine shot.   ? ?Referral sent out in hub for SNF.              ? ? ?Expected Discharge Plan: Skilled Nursing Facility ?Barriers to Discharge: Continued Medical Work up, SNF Pending bed offer ? ? ?Patient Goals and CMS Choice ?  ?CMS Medicare.gov Compare Post Acute Care list provided to:: Patient Represenative (must comment) ?Choice offered to / list presented to : Adult Children ? ?Expected Discharge Plan and Services ?Expected Discharge Plan: Skilled Nursing Facility ?In-house Referral: Clinical Social Work ?  ?Post Acute Care Choice: Skilled Nursing Facility ?Living arrangements for the past 2 months: Single Family Home ?                ?  ?  ?  ?  ?  ?  ?  ?  ?  ?  ? ?Prior Living Arrangements/Services ?Living arrangements for the past 2 months: Single Family Home ?Lives with:: Self ?Patient language and need for interpreter reviewed:: No ?       ?Need for Family Participation in Patient Care: Yes (Comment) ?Care giver support system in place?: Yes (comment) ?Current home services: Other (comment) (none) ?Criminal Activity/Legal Involvement Pertinent to Current Situation/Hospitalization: No - Comment as needed ? ?Activities of Daily Living ?Home Assistive Devices/Equipment: Cane (specify quad or straight) ?ADL Screening (condition at time of admission) ?Patient's cognitive ability adequate to safely complete daily activities?: No ?Is the patient deaf or have difficulty hearing?: Yes ?Does the patient have difficulty seeing,  even when wearing glasses/contacts?: No ?Does the patient have difficulty concentrating, remembering, or making decisions?: Yes ?Patient able to express need for assistance with ADLs?: No ?Does the patient have difficulty dressing or bathing?: Yes ?Independently performs ADLs?: No ?Communication: Needs assistance ?Is this a change from baseline?: Change from baseline, expected to last >3 days ?Dressing (OT): Needs assistance ?Is this a change from baseline?: Change from baseline, expected to last >3 days ?Grooming: Needs assistance ?Is this a change from baseline?: Change from baseline, expected to last >3 days ?Feeding: Needs assistance ?Is this a change from baseline?: Change from baseline, expected to last >3 days ?Bathing: Needs assistance ?Is this a change from baseline?: Change from baseline, expected to last >3 days ?Toileting: Needs assistance ?Is this a change from baseline?: Change from baseline, expected to last >3days ?In/Out Bed: Needs assistance ?Is this a change from baseline?: Change from baseline, expected to last >3 days ?Walks in Home: Needs assistance ?Is this a change from baseline?: Change from baseline, expected to last >3 days ?Does the patient have difficulty walking or climbing stairs?: Yes ?Weakness of Legs: Both ?Weakness of Arms/Hands: None ? ?Permission Sought/Granted ?  ?  ?   ?   ?   ?   ? ?Emotional Assessment ?  ?  ?  ?Orientation: : Oriented to Self ?Alcohol / Substance Use: Not Applicable ?Psych Involvement: No (comment) ? ?Admission diagnosis:  Encephalopathy [G93.40] ?Fall, initial encounter [W19.XXXA] ?  Sepsis (HCC) [A41.9] ?Sepsis, due to unspecified organism, unspecified whether acute organ dysfunction present (HCC) [A41.9] ?Atrial fibrillation with RVR (HCC) [I48.91] ?Patient Active Problem List  ? Diagnosis Date Noted  ? Hypernatremia 11/03/2021  ? Hypokalemia 11/03/2021  ? Dehydration 11/03/2021  ? Atrial fibrillation with RVR (HCC) 11/02/2021  ? Fever 11/02/2021  ? Falls  frequently 11/02/2021  ? Essential hypertension 11/02/2021  ? Thoracic aortic aneurysm without rupture (HCC) 11/02/2021  ? Abnormal head CT 11/02/2021  ? Elevated troponin 11/02/2021  ? Pressure ulcer 11/02/2021  ? ?PCP:  Clovis Riley, L.August Saucer, MD ?Pharmacy:   ?[id number] - [place], [place] - [address] ?[address] ?[place] [address] ?Phone: [phone] Fax: [phone] ? ? ? ? ?Social Determinants of Health (SDOH) Interventions ?  ? ?Readmission Risk Interventions ?   ? View : No data to display.  ?  ?  ?  ? ? ? ?

## 2021-11-04 NOTE — Plan of Care (Signed)
?  Problem: Education: ?Goal: Knowledge of disease or condition will improve ?Outcome: Progressing ?Goal: Understanding of medication regimen will improve ?Outcome: Progressing ?Goal: Individualized Educational Video(s) ?Outcome: Progressing ?  ?Problem: Activity: ?Goal: Ability to tolerate increased activity will improve ?Outcome: Progressing ?  ?Problem: Cardiac: ?Goal: Ability to achieve and maintain adequate cardiopulmonary perfusion will improve ?Outcome: Progressing ?  ?Problem: Health Behavior/Discharge Planning: ?Goal: Ability to safely manage health-related needs after discharge will improve ?Outcome: Progressing ?  ?Problem: Education: ?Goal: Knowledge of General Education information will improve ?Description: Including pain rating scale, medication(s)/side effects and non-pharmacologic comfort measures ?Outcome: Progressing ?  ?Problem: Health Behavior/Discharge Planning: ?Goal: Ability to manage health-related needs will improve ?Outcome: Progressing ?  ?Problem: Clinical Measurements: ?Goal: Ability to maintain clinical measurements within normal limits will improve ?Outcome: Progressing ?Goal: Will remain free from infection ?Outcome: Progressing ?Goal: Diagnostic test results will improve ?Outcome: Progressing ?Goal: Respiratory complications will improve ?Outcome: Progressing ?Goal: Cardiovascular complication will be avoided ?Outcome: Progressing ?  ?Problem: Activity: ?Goal: Risk for activity intolerance will decrease ?Outcome: Progressing ?  ?Problem: Nutrition: ?Goal: Adequate nutrition will be maintained ?Outcome: Progressing ?  ?Problem: Coping: ?Goal: Level of anxiety will decrease ?Outcome: Progressing ?  ?Problem: Elimination: ?Goal: Will not experience complications related to bowel motility ?Outcome: Progressing ?Goal: Will not experience complications related to urinary retention ?Outcome: Progressing ?  ?Problem: Pain Managment: ?Goal: General experience of comfort will improve ?Outcome:  Progressing ?  ?Problem: Safety: ?Goal: Ability to remain free from injury will improve ?Outcome: Progressing ?  ?Problem: Skin Integrity: ?Goal: Risk for impaired skin integrity will decrease ?Outcome: Progressing ?  ?Problem: Fluid Volume: ?Goal: Hemodynamic stability will improve ?Outcome: Progressing ?  ?Problem: Clinical Measurements: ?Goal: Diagnostic test results will improve ?Outcome: Progressing ?Goal: Signs and symptoms of infection will decrease ?Outcome: Progressing ?  ?Problem: Respiratory: ?Goal: Ability to maintain adequate ventilation will improve ?Outcome: Progressing ?  ?

## 2021-11-04 NOTE — NC FL2 (Signed)
?[place] MEDICAID FL2 LEVEL OF CARE SCREENING TOOL  ?  ? ?IDENTIFICATION  ?Patient Name: ?[name] Birthdate: 1922-04-12 Sex: female Admission Date (Current Location): ?11/01/2021  ?[place] Number: ? [place] ?  Facility and Address:  ?[facility], [address], [place], [place] [address] ?     Provider Number: ?[id number]  ?Attending Physician Name and Address:  ?Leroy Sea, MD ? Relative Name and Phone Number:  ?Mahdiya, [place]   [phone] ?   ?Current Level of Care: ?Hospital Recommended Level of Care: ?Skilled Nursing Facility Prior Approval Number: ?  ? ?Date Approved/Denied: ?  PASRR Number: ?[id number] A ? ?Discharge Plan: ?SNF ?  ? ?Current Diagnoses: ?Patient Active Problem List  ? Diagnosis Date Noted  ? Hypernatremia 11/03/2021  ? Hypokalemia 11/03/2021  ? Dehydration 11/03/2021  ? Atrial fibrillation with RVR (HCC) 11/02/2021  ? Fever 11/02/2021  ? Falls frequently 11/02/2021  ? Essential hypertension 11/02/2021  ? Thoracic aortic aneurysm without rupture (HCC) 11/02/2021  ? Abnormal head CT 11/02/2021  ? Elevated troponin 11/02/2021  ? Pressure ulcer 11/02/2021  ? ? ?Orientation RESPIRATION BLADDER Height & Weight   ?  ?Self ? O2 Continent, Indwelling catheter Weight: 127 lb 10.3 oz (57.9 kg) ?Height:  5\' 4"  (162.6 cm)  ?BEHAVIORAL SYMPTOMS/MOOD NEUROLOGICAL BOWEL NUTRITION STATUS  ?    Continent Diet (see discharge summary)  ?AMBULATORY STATUS COMMUNICATION OF NEEDS Skin   ?Total Care Verbally  (ecchymosis, scratches) ?  ?  ?  ?    ?     ?     ? ? ?Personal Care Assistance Level of Assistance  ?Bathing, Feeding, Dressing Bathing Assistance: Maximum assistance ?Feeding assistance: Maximum assistance ?Dressing Assistance: Maximum assistance ?   ? ?Functional Limitations Info  ?Sight, Hearing, Speech Sight Info: Adequate ?Hearing Info: Adequate ?Speech Info: Adequate  ? ? ?SPECIAL CARE FACTORS FREQUENCY  ?PT (By licensed PT), OT (By licensed OT)   ?   ?PT Frequency: 5x week ?OT Frequency: 5x week ?  ?  ?  ?   ? ? ?Contractures Contractures Info: Not present  ? ? ?Additional Factors Info  ?Code Status, Allergies Code Status Info: DNR ?Allergies Info: codeine ?  ?  ?  ?   ? ?Current Medications (11/04/2021):  This is the current hospital active medication list ?Current Facility-Administered Medications  ?Medication Dose Route Frequency Provider Last Rate Last Admin  ? acetaminophen (TYLENOL) tablet 650 mg  650 mg Oral Q6H PRN 11/06/2021, MD   650 mg at 11/02/21 2206  ? Or  ? acetaminophen (TYLENOL) suppository 650 mg  650 mg Rectal Q6H PRN 2207, MD      ? Ampicillin-Sulbactam (UNASYN) 3 g in sodium chloride 0.9 % 100 mL IVPB  3 g Intravenous Q8H Weiskopf, Marin C, RPH 200 mL/hr at 11/04/21 1107 3 g at 11/04/21 1107  ? aspirin suppository 150 mg  150 mg Rectal Daily 11/06/21, MD   150 mg at 11/03/21 1836  ? bisacodyl (DULCOLAX) suppository 10 mg  10 mg Rectal Once 11/05/21, MD      ? Chlorhexidine Gluconate Cloth 2 % PADS 6 each  6 each Topical Daily Leroy Sea, MD   6 each at 11/04/21 1048  ? docusate sodium (COLACE) capsule 200 mg  200 mg Oral BID 11/06/21, MD   200 mg at 11/04/21 1046  ? enoxaparin (LOVENOX) injection 40 mg  40 mg Subcutaneous Q24H 11/06/21,  Victorino Dike, MD   40 mg at 11/03/21 1848  ? hydrALAZINE (APRESOLINE) injection 5 mg  5 mg Intravenous Q4H PRN Jonah Blue, MD      ? LORazepam (ATIVAN) injection 1 mg  1 mg Intravenous Once PRN Leroy Sea, MD      ? metoprolol tartrate (LOPRESSOR) injection 5 mg  5 mg Intravenous Q6H PRN Jonah Blue, MD   5 mg at 11/03/21 3704  ? metoprolol tartrate (LOPRESSOR) tablet 25 mg  25 mg Oral BID Leroy Sea, MD   25 mg at 11/04/21 1046  ? mirtazapine (REMERON SOL-TAB) disintegrating tablet 15 mg  15 mg Oral QHS Leroy Sea, MD      ? ondansetron [facility]) tablet 4 mg  4 mg Oral Q6H PRN Jonah Blue, MD      ? Or  ? ondansetron [facility])  injection 4 mg  4 mg Intravenous Q6H PRN Jonah Blue, MD      ? polyethylene glycol (MIRALAX / GLYCOLAX) packet 17 g  17 g Oral Daily Leroy Sea, MD   17 g at 11/04/21 1050  ? sodium chloride flush (NS) 0.9 % injection 3 mL  3 mL Intravenous Q12H Jonah Blue, MD   3 mL at 11/04/21 1047  ? ? ? ?Discharge Medications: ?Please see discharge summary for a list of discharge medications. ? ?Relevant Imaging Results: ? ?Relevant Lab Results: ? ? ?Additional Information ?SSN: [id number], pt has had one covid vaccine shot. ? ?Lorri Frederick, LCSW ? ? ? ? ?

## 2021-11-05 ENCOUNTER — Inpatient Hospital Stay (HOSPITAL_COMMUNITY): Payer: Medicare Other

## 2021-11-05 DIAGNOSIS — R778 Other specified abnormalities of plasma proteins: Secondary | ICD-10-CM | POA: Diagnosis not present

## 2021-11-05 DIAGNOSIS — I10 Essential (primary) hypertension: Secondary | ICD-10-CM | POA: Diagnosis not present

## 2021-11-05 DIAGNOSIS — I7121 Aneurysm of the ascending aorta, without rupture: Secondary | ICD-10-CM | POA: Diagnosis not present

## 2021-11-05 DIAGNOSIS — I4891 Unspecified atrial fibrillation: Secondary | ICD-10-CM | POA: Diagnosis not present

## 2021-11-05 LAB — CBC WITH DIFFERENTIAL/PLATELET
Abs Immature Granulocytes: 0.05 10*3/uL (ref 0.00–0.07)
Basophils Absolute: 0 10*3/uL (ref 0.0–0.1)
Basophils Relative: 0 %
Eosinophils Absolute: 0.1 10*3/uL (ref 0.0–0.5)
Eosinophils Relative: 1 %
HCT: 36.5 % (ref 36.0–46.0)
Hemoglobin: 11.6 g/dL — ABNORMAL LOW (ref 12.0–15.0)
Immature Granulocytes: 1 %
Lymphocytes Relative: 16 %
Lymphs Abs: 1.7 10*3/uL (ref 0.7–4.0)
MCH: 29.4 pg (ref 26.0–34.0)
MCHC: 31.8 g/dL (ref 30.0–36.0)
MCV: 92.6 fL (ref 80.0–100.0)
Monocytes Absolute: 0.8 10*3/uL (ref 0.1–1.0)
Monocytes Relative: 8 %
Neutro Abs: 7.5 10*3/uL (ref 1.7–7.7)
Neutrophils Relative %: 74 %
Platelets: 226 10*3/uL (ref 150–400)
RBC: 3.94 MIL/uL (ref 3.87–5.11)
RDW: 15.2 % (ref 11.5–15.5)
WBC: 10.1 10*3/uL (ref 4.0–10.5)
nRBC: 0 % (ref 0.0–0.2)

## 2021-11-05 LAB — PROCALCITONIN: Procalcitonin: <0.1 ng/mL

## 2021-11-05 LAB — C-REACTIVE PROTEIN: CRP: 4.4 mg/dL — ABNORMAL HIGH (ref <1.0)

## 2021-11-05 LAB — BRAIN NATRIURETIC PEPTIDE: B Natriuretic Peptide: 348.4 pg/mL — ABNORMAL HIGH (ref 0.0–100.0)

## 2021-11-05 LAB — MAGNESIUM: Magnesium: 1.9 mg/dL (ref 1.7–2.4)

## 2021-11-05 MED ORDER — ROSUVASTATIN CALCIUM 5 MG PO TABS
10.0000 mg | ORAL_TABLET | Freq: Every day | ORAL | Status: DC
  Administered 2021-11-06: 10 mg via ORAL
  Filled 2021-11-05: qty 2

## 2021-11-05 MED ORDER — LOSARTAN POTASSIUM 50 MG PO TABS
100.0000 mg | ORAL_TABLET | Freq: Every day | ORAL | Status: DC
  Administered 2021-11-06: 100 mg via ORAL
  Filled 2021-11-05: qty 2

## 2021-11-05 MED ORDER — ASPIRIN 81 MG PO CHEW
81.0000 mg | CHEWABLE_TABLET | Freq: Every day | ORAL | Status: DC
  Administered 2021-11-05 – 2021-11-08 (×4): 81 mg via ORAL
  Filled 2021-11-05 (×4): qty 1

## 2021-11-05 NOTE — TOC Progression Note (Addendum)
Transition of Care (TOC) - Progression Note  ? ? ?Patient Details  ?Name: [name] ?MRN: [id number] ?Date of Birth: Aug 07, 1921 ? ?Transition of Care (TOC) CM/SW Contact  ?Benard Halsted, LCSW ?Phone Number: ?11/05/2021, 12:49 PM ? ?Clinical Narrative:    ?12:49 PM ?CSW left voicemail for patient's son to discuss SNF offers. CSW left list in room as well.  ? ?2:47 PM ?CSW attempted son again with no success. Left voicemail for daughter.  ? ?5:25pm-Called [facility] again with no answer. CSW contacted patient's granddaughter, Erasmo Downer. She was able to connect [facility] to the call as well. They are hopeful to get patient to a SNF for rehab while they get things in place for patient's home (hiring a private caregiver). [facility] Pickerel stated his sister has been making calls about hospice care but Erasmo Downer is concerned that they will not have enough time to arrange everything prior to patient being discharged. CSW emailed SNF offers to [facility] Pickerel and they will discuss as a family tonight and contact CSW in the morning.  ? ? ?Expected Discharge Plan: [place] ?Barriers to Discharge: Continued Medical Work up, SNF Pending bed offer ? ?Expected Discharge Plan and Services ?Expected Discharge Plan: [place] ?In-house Referral: Clinical Social Work ?  ?Post Acute Care Choice: [place] ?Living arrangements for the past 2 months: [place] ?                ?  ?  ?  ?  ?  ?  ?  ?  ?  ?  ? ? ?Social Determinants of Health (SDOH) Interventions ?  ? ?Readmission Risk Interventions ?   ? View : No data to display.  ?  ?  ?  ? ? ?

## 2021-11-05 NOTE — Care Management Important Message (Signed)
Important Message ? ?Patient Details  ?Name: [name] ?MRN: [id number] ?Date of Birth: 05/05/1922 ? ? ?Medicare Important Message Given:  Yes ? ? ? ? ?[name] ?11/05/2021, 3:36 PM ?

## 2021-11-05 NOTE — Plan of Care (Signed)
?  Problem: Education: ?Goal: Knowledge of disease or condition will improve ?Outcome: Progressing ?Goal: Understanding of medication regimen will improve ?Outcome: Progressing ?Goal: Individualized Educational Video(s) ?Outcome: Progressing ?  ?Problem: Activity: ?Goal: Ability to tolerate increased activity will improve ?Outcome: Progressing ?  ?Problem: Cardiac: ?Goal: Ability to achieve and maintain adequate cardiopulmonary perfusion will improve ?Outcome: Progressing ?  ?Problem: Health Behavior/Discharge Planning: ?Goal: Ability to safely manage health-related needs after discharge will improve ?Outcome: Progressing ?  ?Problem: Education: ?Goal: Knowledge of General Education information will improve ?Description: Including pain rating scale, medication(s)/side effects and non-pharmacologic comfort measures ?Outcome: Progressing ?  ?Problem: Health Behavior/Discharge Planning: ?Goal: Ability to manage health-related needs will improve ?Outcome: Progressing ?  ?Problem: Clinical Measurements: ?Goal: Ability to maintain clinical measurements within normal limits will improve ?Outcome: Progressing ?Goal: Will remain free from infection ?Outcome: Progressing ?Goal: Diagnostic test results will improve ?Outcome: Progressing ?Goal: Respiratory complications will improve ?Outcome: Progressing ?Goal: Cardiovascular complication will be avoided ?Outcome: Progressing ?  ?Problem: Activity: ?Goal: Risk for activity intolerance will decrease ?Outcome: Progressing ?  ?Problem: Nutrition: ?Goal: Adequate nutrition will be maintained ?Outcome: Progressing ?  ?Problem: Coping: ?Goal: Level of anxiety will decrease ?Outcome: Progressing ?  ?Problem: Elimination: ?Goal: Will not experience complications related to bowel motility ?Outcome: Progressing ?Goal: Will not experience complications related to urinary retention ?Outcome: Progressing ?  ?Problem: Pain Managment: ?Goal: General experience of comfort will improve ?Outcome:  Progressing ?  ?Problem: Safety: ?Goal: Ability to remain free from injury will improve ?Outcome: Progressing ?  ?Problem: Skin Integrity: ?Goal: Risk for impaired skin integrity will decrease ?Outcome: Progressing ?  ?Problem: Fluid Volume: ?Goal: Hemodynamic stability will improve ?Outcome: Progressing ?  ?Problem: Clinical Measurements: ?Goal: Diagnostic test results will improve ?Outcome: Progressing ?Goal: Signs and symptoms of infection will decrease ?Outcome: Progressing ?  ?Problem: Respiratory: ?Goal: Ability to maintain adequate ventilation will improve ?Outcome: Progressing ?  ?

## 2021-11-05 NOTE — Progress Notes (Signed)
Remains in rhythm. Will continue metop at current dosing. Needs better BP control and will increase losartan to 100mg  daily. Given advanced age and frequent falls, agree that [facility] is too high risk.  ? ?Has severe AI and moderate-to-severe aortic dilation. Given advanced age, will manage conservatively.  ? ?Cardiology will sign off. ? ?Please feel free to call with questions or concerns. ? ?[facility], MD ?

## 2021-11-05 NOTE — Progress Notes (Signed)
?                                  PROGRESS NOTE                                             ?                                                                                                                     ?                                         ? ? Patient Demographics:  ? ? [name], is a 88 y.o. female, DOB - [birth year], JYN:[id number] ? ?Outpatient Primary MD for the patient is Alroy Dust, L.Marlou Sa, MD    LOS - 3  Admit date - 11/01/2021   ? ?Chief Complaint  ?Patient presents with  ? Fall  ?    ? ?Brief Narrative (HPI from H&P)  - 88 y.o. female with medical history significant of HTN presenting with a fall.  I spoke with her son.  Her daughter was there when she fell and called 911, cording to the family members she has had a few falls over the past several weeks which is unusual for her, she has also had a poor appetite and had 15 to 20 pounds of unintentional weight loss.  She was brought to the ER for this present fall where she was found to have A-fib with RVR, ED did not show any acute changes but there was a age indeterminant frontal lobe low-density area suspicious for infarct. ? ? Subjective:  ? ?In bed, resting, unable to answer questions or commands ? ? Assessment  & Plan :  ? ? 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  Likely due to underlying A-fib RVR, it was decided at the time of admission by the cardiology team and family that patient will not receive anticoagulation due to her advanced age and fall risk.  Discussed patient's clinical scenario in detail with family, they do not want heroics or full stroke work-up, they only want gentle medical treatment with medications, aspirin and statin will be continued for secondary prevention.  No further work-up will be done as management will not change.  Stroke  most likely is embolic and she is not anticoagulation candidate.  Continue supportive care, long-term outcome appears poor family is open for hospice.  Will involve palliative care ? ? ?2.  New onset A-fib RVR.  Mali vas 2 score is greater than 3.  However poor candidate for anticoagulation due to multiple  falls and advanced age, seen by cardiology, on Cardizem drip, goal will be rate controlled.  Once able to take oral medications p.o. Lopressor.  Echocardiogram noted with stable TSH. ? ?3.  Urinary retention with abdominal distention.  CT abdomen pelvis nonacute.  Bladder scan did show more than 350 cc of urine, Foley and Flomax check KUB to rule out any significant stool burden. ? ?4.  Fever at the time of admission.  Of note daughter declined LP at the time of admission, CT abdomen pelvis nonacute, UA unremarkable, at risk for aspiration pneumonia.  For now place her on Unasyn and monitor. ? ?5.  Thoracic aneurysm.  4.8 cm, due to her age not a candidate for repair, beta-blocker and monitor.   ? ?6.  Dehydration with hypernatremia and hypokalemia.  IV D5W and replace potassium, monitor.   ? ?7. Multiple falls, failure to thrive, unintentional weight loss and poor appetite and insomnia.  PT, OT and speech eval.  Remeron for stimulating appetite and help with insomnia.  May require placement. ? ?8.  Her on echocardiogram.  Supportive care. ? ?   ? ?Condition - Extremely Guarded ? ?Family Communication  :  son Fritz Pickerel - [phone], grand daughter bedside 11/04/21 . ? ?Fritz Pickerel and granddaughter over the phone on 11/05/2021 in detail. ? ?Code Status :  DNR ? ?Consults  :  Cards ? ?PUD Prophylaxis :   ? ? Procedures  :    ? ?MRI -  IMPRESSION: ?1. Evolving subacute nonhemorrhagic ischemic infarct involving the ?right frontal operculum, corresponding with abnormality on prior CT. ?Additional scattered small volume subacute ischemic nonhemorrhagic ?infarcts involving the bilateral cerebral hemispheres, right pons ?and  brachium pontis, and right cerebellum as above. ?2. Irregular flow voids within both vertebral arteries as they ?course into the cranial vault, which could be related to slow flow ?and/or occlusion. ?3. Age-related cerebral atrophy with moderate chronic microvascular ?ischemic disease. ?4. Right sphenoid sinusitis. ? ?TTE -  ? ? 1. Limited echo due to uncooperative patient.  ? 2. Left ventricular ejection fraction, by estimation, is 60 to 65%. The  ?left ventricle has normal function. The left ventricle has no regional  ?wall motion abnormalities. There is severe concentric left ventricular  ?hypertrophy. Diastolic function  ?indeterminant due to severe MAC.  ? 3. Right ventricular systolic function is normal. The right ventricular  ?size is normal.  ? 4. Left atrial size was moderately dilated.  ? 5. The mitral valve is abnormal. Severe leaflet thickening and  ?calcification. Trivial mitral valve regurgitation. Severe mitral annular  ?calcification.  ? 6. The aortic valve is tricuspid. There is moderate calcification of the  ?aortic valve. There is moderate thickening of the aortic valve. Aortic  ?valve regurgitation is severe.  ? 7. Aortic dilatation noted. There is moderate dilatation of the ascending  ?aorta, measuring 48 mm. ? ? ? ?CT head -  1. No acute intracranial hemorrhage.  No skull fracture. 2. Low-density with lack of gray-white differentiation in the right frontal lobe is consistent with age indeterminate ischemia, but new from 2021 exam. Consider MRI for further assessment as clinically indicated. 3. Background atrophy and chronic small vessel ischemic change. ? ?CT -  1.  Limited exam due to patient's motion. 2. Within the limitations no CT evidence of acute visceral or vascular injury in the chest, abdomen or pelvis. 3. Aneurysmal dilatation of the ascending thoracic aorta measuring up to 4.8 cm. 4. Degenerate disc disease of the thoracolumbar spine. No acute  fracture of the spine was noted.  Evaluation of pelvis is significantly limited due to motion, a follow-up radiograph would be helpful if clinically warranted. 5. Advanced atherosclerotic disease of abdominal aorta and branch vessels. 6.  Cholelithiasis without evidence of acute cholecystitis.  ? ?   ? ?Disposition Plan  :   ? ?Status is: Inpatient ? ?DVT Prophylaxis  :   ? ?enoxaparin (LOVENOX) injection 40 mg Start: 11/02/21 1615 ? ?Lab Results  ?Component Value Date  ? PLT 226 11/05/2021  ? ? ?Diet :  ?Diet Order   ? ?       ?  Diet Heart Room service appropriate? Yes; Fluid consistency: Thin  Diet effective now       ?  ? ?  ?  ? ?  ?  ? ?Inpatient Medications ? ?Scheduled Meds: ? aspirin  81 mg Oral Daily  ? bisacodyl  10 mg Rectal Once  ? Chlorhexidine Gluconate Cloth  6 each Topical Daily  ? docusate sodium  200 mg Oral BID  ? enoxaparin (LOVENOX) injection  40 mg Subcutaneous Q24H  ? [START ON 11/06/2021] losartan  100 mg Oral Daily  ? metoprolol tartrate  25 mg Oral BID  ? mirtazapine  15 mg Oral QHS  ? polyethylene glycol  17 g Oral Daily  ? rosuvastatin  10 mg Oral Daily  ? sodium chloride flush  3 mL Intravenous Q12H  ? ?Continuous Infusions: ? ampicillin-sulbactam (UNASYN) IV 3 g (11/05/21 1048)  ? ?PRN Meds:.acetaminophen **OR** acetaminophen, hydrALAZINE, metoprolol tartrate, ondansetron **OR** ondansetron (ZOFRAN) IV ? ?Antibiotics  :   ? ?Anti-infectives (From admission, onward)  ? ? Start     Dose/Rate Route Frequency Ordered Stop  ? 11/03/21 1130  Ampicillin-Sulbactam (UNASYN) 3 g in sodium chloride 0.9 % 100 mL IVPB       ? 3 g ?200 mL/hr over 30 Minutes Intravenous Every 8 hours 11/03/21 1040    ? 11/01/21 1915  vancomycin (VANCOCIN) IVPB 1000 mg/200 mL premix       ? 1,000 mg ?200 mL/hr over 60 Minutes Intravenous  Once 11/01/21 1909 11/01/21 2253  ? 11/01/21 1745  cefTRIAXone (ROCEPHIN) 1 g in sodium chloride 0.9 % 100 mL IVPB       ? 1 g ?200 mL/hr over 30 Minutes Intravenous  Once 11/01/21 1736 11/01/21 1917  ? ?  ? ? ?Time  Spent in minutes  30 ? ? ?Lala Lund M.D on 11/05/2021 at 12:07 PM ? ?To page go to [email or web address]  ? ?[facility] -  Office  [phone] ? ?See all Orders from today for further details ? ? ? Objective

## 2021-11-05 NOTE — Progress Notes (Signed)
?                                  PROGRESS NOTE                                             ?                                                                                                                     ?                                         ? ? Patient Demographics:  ? ? [name], is a 88 y.o. female, DOB - 1922/04/08, [id number] ? ?Outpatient Primary MD for the patient is Alroy Dust, L.Marlou Sa, MD    LOS - 3  Admit date - 11/01/2021   ? ?Chief Complaint  ?Patient presents with  ? Fall  ?    ? ?Brief Narrative (HPI from H&P)  - 88 y.o. female with medical history significant of HTN presenting with a fall.  I spoke with her son.  Her daughter was there when she fell and called 911, cording to the family members she has had a few falls over the past several weeks which is unusual for her, she has also had a poor appetite and had 15 to 20 pounds of unintentional weight loss.  She was brought to the ER for this present fall where she was found to have A-fib with RVR, ED did not show any acute changes but there was a age indeterminant frontal lobe low-density area suspicious for infarct. ? ? Subjective:  ? ?In bed, resting, unable to answer questions or commands ? ? Assessment  & Plan :  ? ? ?Multiple falls at home with a fall at the day of admission.  No clear reason, CT head inconclusive stable age-indeterminate infarct in the right frontal lobe, also found to have new onset A-fib RVR.  Currently quite delirious unable to cooperate in exam or onset.  Will try to rule out acute stroke and obtain MRI - [facility] staff 11/04/21 @ 10 am, unable to take oral medications for now hence aspirin suppository, PT OT and speech eval.  We will also go ahead and check lipid panel and A1c.  Monitor. ? ? ?2.  New onset A-fib RVR.  Mali vas 2 score is greater than 3.  However poor candidate for anticoagulation due to multiple falls and advanced age, seen by cardiology, on  Cardizem drip, goal will be rate controlled.  Once able to take oral medications p.o. Lopressor.  Echocardiogram pending Will monitor. ? ?3.  Urinary retention with abdominal distention.  CT abdomen pelvis nonacute.  Bladder scan  did show more than 350 cc of urine, Foley and Flomax check KUB to rule out any significant stool burden. ? ?4.  Fever at the time of admission.  Of note daughter declined LP at the time of admission, CT abdomen pelvis nonacute, UA unremarkable, at risk for aspiration pneumonia.  For now place her on Unasyn and monitor. ? ?5.  Thoracic aneurysm.  4.8 cm, due to her age not a candidate for repair, beta-blocker and monitor.   ? ?6.  Dehydration with hypernatremia and hypokalemia.  IV D5W and replace potassium, monitor.   ? ?7. Multiple falls, failure to thrive, unintentional weight loss and poor appetite and insomnia.  PT, OT and speech eval.  Remeron for stimulating appetite and help with insomnia.  May require placement. ? ?   ? ?Condition - Extremely Guarded ? ?Family Communication  :  son Fritz Pickerel [id number], grand daughter bedside 11/04/21  ? ?Code Status :  DNR ? ?Consults  :  Cards ? ?PUD Prophylaxis :   ? ? Procedures  :    ? ?MRI ? ?TTE -  ? ? ?CT head -  1. No acute intracranial hemorrhage.  No skull fracture. 2. Low-density with lack of gray-white differentiation in the right frontal lobe is consistent with age indeterminate ischemia, but new from 2021 exam. Consider MRI for further assessment as clinically indicated. 3. Background atrophy and chronic small vessel ischemic change. ? ?CT -  1.  Limited exam due to patient's motion. 2. Within the limitations no CT evidence of acute visceral or vascular injury in the chest, abdomen or pelvis. 3. Aneurysmal dilatation of the ascending thoracic aorta measuring up to 4.8 cm. 4. Degenerate disc disease of the thoracolumbar spine. No acute fracture of the spine was noted. Evaluation of pelvis is significantly limited due to motion, a  follow-up radiograph would be helpful if clinically warranted. 5. Advanced atherosclerotic disease of abdominal aorta and branch vessels. 6.  Cholelithiasis without evidence of acute cholecystitis.  ? ?   ? ?Disposition Plan  :   ? ?Status is: Inpatient ? ?DVT Prophylaxis  :   ? ?enoxaparin (LOVENOX) injection 40 mg Start: 11/02/21 1615 ? ?Lab Results  ?Component Value Date  ? PLT 226 11/05/2021  ? ? ?Diet :  ?Diet Order   ? ?       ?  Diet Heart Room service appropriate? Yes; Fluid consistency: Thin  Diet effective now       ?  ? ?  ?  ? ?  ?  ? ?Inpatient Medications ? ?Scheduled Meds: ? aspirin  150 mg Rectal Daily  ? bisacodyl  10 mg Rectal Once  ? Chlorhexidine Gluconate Cloth  6 each Topical Daily  ? docusate sodium  200 mg Oral BID  ? enoxaparin (LOVENOX) injection  40 mg Subcutaneous Q24H  ? losartan  50 mg Oral Daily  ? metoprolol tartrate  25 mg Oral BID  ? mirtazapine  15 mg Oral QHS  ? polyethylene glycol  17 g Oral Daily  ? sodium chloride flush  3 mL Intravenous Q12H  ? ?Continuous Infusions: ? ampicillin-sulbactam (UNASYN) IV 200 mL/hr at 11/05/21 0334  ? ?PRN Meds:.acetaminophen **OR** acetaminophen, hydrALAZINE, metoprolol tartrate, ondansetron **OR** ondansetron (ZOFRAN) IV ? ?Antibiotics  :   ? ?Anti-infectives (From admission, onward)  ? ? Start     Dose/Rate Route Frequency Ordered Stop  ? 11/03/21 1130  Ampicillin-Sulbactam (UNASYN) 3 g in sodium chloride 0.9 % 100 mL IVPB       ?  3 g ?200 mL/hr over 30 Minutes Intravenous Every 8 hours 11/03/21 1040    ? 11/01/21 1915  vancomycin (VANCOCIN) IVPB 1000 mg/200 mL premix       ? 1,000 mg ?200 mL/hr over 60 Minutes Intravenous  Once 11/01/21 1909 11/01/21 2253  ? 11/01/21 1745  cefTRIAXone (ROCEPHIN) 1 g in sodium chloride 0.9 % 100 mL IVPB       ? 1 g ?200 mL/hr over 30 Minutes Intravenous  Once 11/01/21 1736 11/01/21 1917  ? ?  ? ? ?Time Spent in minutes  30 ? ? ?Lala Lund M.D on 11/05/2021 at 6:13 AM ? ?To page go to [email or web address]  ? ?[facility] -  Office  [phone] ? ?See all Orders from today for further details ? ? ? Objective:  ? ?Vitals:  ? 11/04/21 1932 11/04/21 2300 11/05/21 0019 11/05/21 0323  ?BP: (!) 175/63 (!) 181/66 (!) 176/59 (!) 174/101  ?Pulse: 69 71    ?Resp: 20 20 20  (!) 23  ?Temp: 98 ?F (36.7 ?C) 98.1 ?F (36.7 ?C)  98 ?F (36.7 ?C)  ?TempSrc: Oral Axillary  Axillary  ?SpO2: 95% 93%    ?Weight:      ?Height:      ? ? ?Wt Readings from Last 3 Encounters:  ?11/02/21 57.9 kg  ?04/01/20 72.6 kg  ? ? ? ?Intake/Output Summary (Last 24 hours) at 11/05/2021 [id number] ?Last data filed at 11/05/2021 0334 ?Gross per 24 hour  ?Intake 571.13 ml  ?Output 600 ml  ?Net -28.87 ml  ? ? ? ?Physical Exam ? ?Awake but confused unable to answer questions or follow commands, moving all 4 extremities to painful stimuli ?[place].AT,PERRAL ?Supple Neck, No JVD,   ?Symmetrical Chest wall movement, Good air movement bilaterally, CTAB ?RRR,No Gallops, Rubs or new Murmurs,  ?+ve B.Sounds, Abd distended but appears nontender,  ?No Cyanosis, Clubbing or edema  ? ?  ? ?RN pressure injury documentation: ?Pressure Injury 11/01/21 Sacrum Stage 2 -  Partial thickness loss of dermis presenting as a shallow open injury with a red, pink wound bed without slough. (Active)  ?11/01/21 1905  ?Location: Sacrum  ?Location Orientation:   ?Staging: Stage 2 -  Partial thickness loss of dermis presenting as a shallow open injury with a red, pink wound bed without slough. (reporting nurse advises of stage 2 sacral ulcer at shift change)  ?Wound Description (Comments):   ?Present on Admission: Yes  ?Dressing Type Foam - Lift dressing to assess site every shift 11/05/21 0400  ? ? ? Data Review:  ? ? ?CBC ?Recent Labs  ?Lab 11/01/21 ?1639 11/03/21 ?0702 11/04/21 ?0140 11/05/21 ?[id number]  ?WBC 15.1* 12.2* 9.4 10.1  ?HGB 13.4 12.6 11.5* 11.6*  ?HCT 40.7 37.2 34.4* 36.5  ?PLT 226 235 227 226  ?MCV 88.1 88.6 89.4 92.6  ?MCH 29.0 30.0 29.9 29.4  ?MCHC 32.9 33.9 33.4 31.8  ?RDW 15.2 15.1 15.3 15.2   ?LYMPHSABS 1.2  --  1.7 1.7  ?MONOABS 1.0  --  0.9 0.8  ?EOSABS 0.0  --  0.0 0.1  ?BASOSABS 0.0  --  0.0 0.0  ? ? ?Electrolytes ?Recent Labs  ?Lab 11/01/21 ?1639 11/01/21 ?1835 11/02/21 ?[id number] 11/03/21 ?[id number]

## 2021-11-06 DIAGNOSIS — Z7189 Other specified counseling: Secondary | ICD-10-CM

## 2021-11-06 DIAGNOSIS — R627 Adult failure to thrive: Secondary | ICD-10-CM

## 2021-11-06 DIAGNOSIS — R778 Other specified abnormalities of plasma proteins: Secondary | ICD-10-CM | POA: Diagnosis not present

## 2021-11-06 DIAGNOSIS — W19XXXA Unspecified fall, initial encounter: Secondary | ICD-10-CM

## 2021-11-06 DIAGNOSIS — R638 Other symptoms and signs concerning food and fluid intake: Secondary | ICD-10-CM | POA: Diagnosis not present

## 2021-11-06 DIAGNOSIS — Z515 Encounter for palliative care: Secondary | ICD-10-CM

## 2021-11-06 DIAGNOSIS — I10 Essential (primary) hypertension: Secondary | ICD-10-CM | POA: Diagnosis not present

## 2021-11-06 DIAGNOSIS — I7121 Aneurysm of the ascending aorta, without rupture: Secondary | ICD-10-CM | POA: Diagnosis not present

## 2021-11-06 DIAGNOSIS — I4891 Unspecified atrial fibrillation: Secondary | ICD-10-CM | POA: Diagnosis not present

## 2021-11-06 DIAGNOSIS — Z66 Do not resuscitate: Secondary | ICD-10-CM

## 2021-11-06 LAB — CULTURE, BLOOD (ROUTINE X 2)
Culture: NO GROWTH
Culture: NO GROWTH
Special Requests: ADEQUATE
Special Requests: ADEQUATE

## 2021-11-06 LAB — CBC WITH DIFFERENTIAL/PLATELET
Abs Immature Granulocytes: 0.03 10*3/uL (ref 0.00–0.07)
Basophils Absolute: 0 10*3/uL (ref 0.0–0.1)
Basophils Relative: 0 %
Eosinophils Absolute: 0.1 10*3/uL (ref 0.0–0.5)
Eosinophils Relative: 1 %
HCT: 37 % (ref 36.0–46.0)
Hemoglobin: 12.3 g/dL (ref 12.0–15.0)
Immature Granulocytes: 0 %
Lymphocytes Relative: 21 %
Lymphs Abs: 1.7 10*3/uL (ref 0.7–4.0)
MCH: 29.7 pg (ref 26.0–34.0)
MCHC: 33.2 g/dL (ref 30.0–36.0)
MCV: 89.4 fL (ref 80.0–100.0)
Monocytes Absolute: 0.7 10*3/uL (ref 0.1–1.0)
Monocytes Relative: 8 %
Neutro Abs: 5.7 10*3/uL (ref 1.7–7.7)
Neutrophils Relative %: 70 %
Platelets: 240 10*3/uL (ref 150–400)
RBC: 4.14 MIL/uL (ref 3.87–5.11)
RDW: 15.1 % (ref 11.5–15.5)
WBC: 8.2 10*3/uL (ref 4.0–10.5)
nRBC: 0 % (ref 0.0–0.2)

## 2021-11-06 LAB — BRAIN NATRIURETIC PEPTIDE: B Natriuretic Peptide: 316.4 pg/mL — ABNORMAL HIGH (ref 0.0–100.0)

## 2021-11-06 LAB — C-REACTIVE PROTEIN: CRP: 3.6 mg/dL — ABNORMAL HIGH (ref <1.0)

## 2021-11-06 LAB — MAGNESIUM: Magnesium: 2 mg/dL (ref 1.7–2.4)

## 2021-11-06 MED ORDER — BISACODYL 5 MG PO TBEC
10.0000 mg | DELAYED_RELEASE_TABLET | Freq: Once | ORAL | Status: AC
  Administered 2021-11-06: 10 mg via ORAL
  Filled 2021-11-06: qty 2

## 2021-11-06 MED ORDER — METOPROLOL TARTRATE 25 MG PO TABS
25.0000 mg | ORAL_TABLET | Freq: Once | ORAL | Status: AC
Start: 2021-11-06 — End: 2021-11-06
  Administered 2021-11-06: 25 mg via ORAL
  Filled 2021-11-06: qty 1

## 2021-11-06 MED ORDER — DILTIAZEM HCL 25 MG/5ML IV SOLN: 10.0000 mg | Freq: Four times a day (QID) | INTRAVENOUS | Status: DC | PRN

## 2021-11-06 MED ORDER — METOPROLOL TARTRATE 50 MG PO TABS
50.0000 mg | ORAL_TABLET | Freq: Two times a day (BID) | ORAL | Status: DC
  Administered 2021-11-06 – 2021-11-08 (×5): 50 mg via ORAL
  Filled 2021-11-06 (×5): qty 1

## 2021-11-06 MED ORDER — EZETIMIBE 10 MG PO TABS
10.0000 mg | ORAL_TABLET | Freq: Every day | ORAL | Status: DC
  Administered 2021-11-06 – 2021-11-08 (×3): 10 mg via ORAL
  Filled 2021-11-06 (×4): qty 1

## 2021-11-06 MED ORDER — LABETALOL HCL 5 MG/ML IV SOLN
5.0000 mg | Freq: Once | INTRAVENOUS | Status: DC
  Filled 2021-11-06: qty 4

## 2021-11-06 NOTE — Progress Notes (Signed)
**Note Jocelyn-Identified via Obfuscation** ?                                  PROGRESS NOTE                                             ?                                                                                                                     ?                                         ? ? Patient Demographics:  ? ? [name], is a 88 y.o. female, DOB - June 29, 1922, [id number] ? ?Outpatient Primary MD for the patient is Alroy Dust, L.Marlou Sa, MD    LOS - 4  Admit date - 11/01/2021   ? ?Chief Complaint  ?Patient presents with  ? Fall  ?    ? ?Brief Narrative (HPI from H&P)  - 88 y.o. female with medical history significant of HTN presenting with a fall.  I spoke with her son.  Her daughter was there when she fell and called 911, cording to the family members she has had a few falls over the past several weeks which is unusual for her, she has also had a poor appetite and had 15 to 20 pounds of unintentional weight loss.  She was brought to the ER for this present fall where she was found to have A-fib with RVR, ED did not show any acute changes but there was a age indeterminant frontal lobe low-density area suspicious for infarct. ? ? Subjective:  ? ?Patient in bed confused but in no distress, denies any headache.  Follows some basic commands but still unreliable historian. ? ? Assessment  & Plan :  ? ? 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  Likely due to underlying A-fib RVR, it was decided at the time of admission by the cardiology team and family that patient will not receive anticoagulation due to her advanced age and fall risk.  Discussed patient's clinical scenario in detail with family, they do not want heroics or full stroke work-up, they only want gentle medical treatment with medications, aspirin and statin will be continued for secondary prevention.  No  further work-up will be done as management will not change.  Stroke most likely is embolic and she is not anticoagulation candidate.  Continue supportive care, long-term outcome appears poor family is open for hospice.  Will involve palliative care, family now wishes to take her home with hospice. ? ? ?2.  New onset  A-fib RVR.  Mali vas 2 score is greater than 3.  However poor candidate for anticoagulation due to multiple falls and advanced age as decided by cardiology, seen by cardiology, was on Cardizem drip now transition to oral Lopressor along with as needed IV Cardizem and IV Lopressor as needed.  Echocardiogram noted with stable TSH. ? ?3.  Urinary retention with abdominal distention.  CT abdomen pelvis nonacute.  Bladder scan did show more than 350 cc of urine, Foley and Flomax ,, had constipation with stool burden as well.  Improved after bowel regimen.  Will remove Foley this evening. ? ?4.  Fever at the time of admission.  Of note daughter declined LP at the time of admission, CT abdomen pelvis nonacute, UA unremarkable, at risk for aspiration pneumonia.  For now place her on Unasyn and monitor. ? ?5.  Thoracic aneurysm.  4.8 cm, due to her age not a candidate for repair, beta-blocker and monitor.   ? ?6.  Dehydration with hypernatremia and hypokalemia.  IV D5W and replace potassium, monitor.   ? ?7. Multiple falls, failure to thrive, unintentional weight loss and poor appetite and insomnia.  PT, OT and speech eval.  Remeron for stimulating appetite and help with insomnia.  Will qualify for SNF but family now wishes to take home with hospice. ? ?8.  Aortic regurgitation on echocardiogram.  Supportive care. ? ?   ? ?Condition - Extremely Guarded ? ?Family Communication  :  son Fritz Pickerel - [phone], grand daughter bedside 11/04/21 . ? ?Fritz Pickerel and granddaughter over the phone on 11/05/2021 in detail. ? ?Daughter over the phone on 11/05/2021, 11/06/2021 ? ?Code Status :  DNR ? ?Consults  :  Cards ? ?PUD  Prophylaxis :   ? ? Procedures  :    ? ?MRI -  IMPRESSION: ?1. Evolving subacute nonhemorrhagic ischemic infarct involving the right frontal operculum, corresponding with abnormality on prior CT. Additional scattered small volume subacute ischemic nonhemorrhagic infarcts involving the bilateral cerebral hemispheres, right pons and brachium pontis, and right cerebellum as above. ?2. Irregular flow voids within both vertebral arteries as they course into the cranial vault, which could be related to slow flow ?and/or occlusion. ?3. Age-related cerebral atrophy with moderate chronic microvascular ischemic disease. ?4. Right sphenoid sinusitis. ? ?TTE -  ? ? 1. Limited echo due to uncooperative patient.  ? 2. Left ventricular ejection fraction, by estimation, is 60 to 65%. The  ?left ventricle has normal function. The left ventricle has no regional  ?wall motion abnormalities. There is severe concentric left ventricular  ?hypertrophy. Diastolic function  ?indeterminant due to severe MAC.  ? 3. Right ventricular systolic function is normal. The right ventricular  ?size is normal.  ? 4. Left atrial size was moderately dilated.  ? 5. The mitral valve is abnormal. Severe leaflet thickening and  ?calcification. Trivial mitral valve regurgitation. Severe mitral annular  ?calcification.  ? 6. The aortic valve is tricuspid. There is moderate calcification of the  ?aortic valve. There is moderate thickening of the aortic valve. Aortic  ?valve regurgitation is severe.  ? 7. Aortic dilatation noted. There is moderate dilatation of the ascending  ?aorta, measuring 48 mm. ? ? ? ?CT head -  1. No acute intracranial hemorrhage.  No skull fracture. 2. Low-density with lack of gray-white differentiation in the right frontal lobe is consistent with age indeterminate ischemia, but new from 2021 exam. Consider MRI for further assessment as clinically indicated. 3. Background atrophy and chronic small vessel  ischemic change. ? ?CT -  1.   Limited exam due to patient's motion. 2. Within the limitations no CT evidence of acute visceral or vascular injury in the chest, abdomen or pelvis. 3. Aneurysmal dilatation of the ascending thoracic aorta measuring up to 4.8 cm. 4. Degenerate disc disease of the thoracolumbar spine. No acute fracture of the spine was noted. Evaluation of pelvis is significantly limited due to motion, a follow-up radiograph would be helpful if clinically warranted. 5. Advanced atherosclerotic disease of abdominal aorta and branch vessels. 6.  Cholelithiasis without evidence of acute cholecystitis.  ? ?   ? ?Disposition Plan  :   ? ?Status is: Inpatient ? ?DVT Prophylaxis  :   ? ?enoxaparin (LOVENOX) injection 40 mg Start: 11/02/21 1615 ? ?Lab Results  ?Component Value Date  ? PLT 240 11/06/2021  ? ? ?Diet :  ?Diet Order   ? ?       ?  Diet Heart Room service appropriate? Yes; Fluid consistency: Thin  Diet effective now       ?  ? ?  ?  ? ?  ?  ? ?Inpatient Medications ? ?Scheduled Meds: ? aspirin  81 mg Oral Daily  ? bisacodyl  10 mg Rectal Once  ? Chlorhexidine Gluconate Cloth  6 each Topical Daily  ? docusate sodium  200 mg Oral BID  ? enoxaparin (LOVENOX) injection  40 mg Subcutaneous Q24H  ? ezetimibe  10 mg Oral Daily  ? metoprolol tartrate  25 mg Oral Once  ? metoprolol tartrate  50 mg Oral BID  ? mirtazapine  15 mg Oral QHS  ? polyethylene glycol  17 g Oral Daily  ? sodium chloride flush  3 mL Intravenous Q12H  ? ?Continuous Infusions: ? ampicillin-sulbactam (UNASYN) IV Stopped (11/06/21 0329)  ? ?PRN Meds:.acetaminophen **OR** acetaminophen, diltiazem, hydrALAZINE, metoprolol tartrate, ondansetron **OR** ondansetron (ZOFRAN) IV ? ?Antibiotics  :   ? ?Anti-infectives (From admission, onward)  ? ? Start     Dose/Rate Route Frequency Ordered Stop  ? 11/03/21 1130  Ampicillin-Sulbactam (UNASYN) 3 g in sodium chloride 0.9 % 100 mL IVPB       ? 3 g ?200 mL/hr over 30 Minutes Intravenous Every 8 hours 11/03/21 1040    ? 11/01/21  1915  vancomycin (VANCOCIN) IVPB 1000 mg/200 mL premix       ? 1,000 mg ?200 mL/hr over 60 Minutes Intravenous  Once 11/01/21 1909 11/01/21 2253  ? 11/01/21 1745  cefTRIAXone (ROCEPHIN) 1 g in sodium chloride 0.9

## 2021-11-06 NOTE — TOC Progression Note (Signed)
Transition of Care (TOC) - Progression Note  ? ? ?Patient Details  ?Name: [name] ?MRN: [id number] ?Date of Birth: 04/05/1922 ? ?Transition of Care (TOC) CM/SW Contact  ?Benard Halsted, LCSW ?Phone Number: ?11/06/2021, 9:44 AM ? ?Clinical Narrative:    ?CSW received call from patient's son, Fritz Pickerel. CSW was able to add daughter [name] to the call as well. CSW answered questions about home care and hospice. [name] stated she had already spoken with [place] and asked questions as well. She stated surprise that patient is medically stable for discharge. CSW explained that attempts had been made multiple times yesterday to reach family, which was not successfully done until yesterday evening. She reported needing time to prepare patient's home by cleaning and moving furniture, installing wall rails, and hiring caregivers that will supplement hospice. CSW expressed understanding but that patient cannot remain in the hospital once medically ready to discharge. Family in agreement to refer to [facility] home hospice. CSW sent referral and they will reach out to family this afternoon. CSW Product/process development scientist of private duty agencies and medical alert agencies to [place] and [name]. Hospice will need to deliver DME. Fritz Pickerel reported agreement with [name] being the point of contact.  ? ? ?Expected Discharge Plan: [place] ?Barriers to Discharge:  (Family getting home ready; will need Hospice DME) ? ?Expected Discharge Plan and Services ?Expected Discharge Plan: [place] ?In-house Referral: Clinical Social Work ?  ?Post Acute Care Choice: Hospice ?Living arrangements for the past 2 months: [place] ?                ?  ?  ?  ?  ?  ?  ?  ?  ?  ?  ? ? ?Social Determinants of Health (SDOH) Interventions ?  ? ?Readmission Risk Interventions ?   ? View : No data to display.  ?  ?  ?  ? ? ?

## 2021-11-06 NOTE — Progress Notes (Addendum)
Civil engineer, contracting [facility]) Hospital Liaison Note ? ?Referral received for patient/family interest in home with hospice. [facility] liaison spoke with patient's daughter Darl Pikes. Interest confirmed.  ? ?Hospice eligibility confirmed.  ? ?Plan is to discharge home tomorrow via PTAR ? ?DME in the home: none ? ?DME needs: AV nurse will assess at admission ? ?Please send comfort prescriptions/medications home with patient at discharge.  ? ?Please call with any questions or concerns. Thank you ? ?Dionicio Stall, LCSW ?[facility] Hospital Liaison ?[phone] ? ?Please call with any questions or concerns. Thank you ? ?Dionicio Stall, LCSW ?[facility] Hospital Liaison ?[phone] ?

## 2021-11-06 NOTE — Progress Notes (Signed)
Speech Language Pathology Treatment: Dysphagia  ?Patient Details ?Name: [name] ?MRN: [id number] ?DOB: [birth year] ?Today's Date: 11/06/2021 ?Time: 1355-1415 ?SLP Time Calculation (min) (ACUTE ONLY): 20 min ? ?Assessment / Plan / Recommendation ?Clinical Impression ? Patient seen by SLP for skilled treatment session focused on dysphagia goals. Patient was awake, alert, pleasantly confused. Difficult to determine if patient with hearing difficulties but when SLP wrote out a basic question, patient did not demonstrate ability to comprehend. Patient agreeable to eating small amount (two spoonfuls) of diced canned peaches and a coupld sips of water. She exhibited mildly prolonged mastication and bolus formation with peaches but no overt s/s aspiration or penetration with solids or liquids. Patient took lid off lunch plate and said, "it all looks so good but I can't eat anything". She then perseverated on asking SLP if there was "a place you can store this?" and suggesting SLP could take food tray home. Patient appears to be tolerating PO's despite limited amount she consumed during this session. Unfortunately, it appears that her cognitive impairment is impacting her ability to sustain attention for PO intake. SLP to s/o at this time as plan is for patient to discharge home with hospice care next date. ? ?  ?HPI HPI: [name] is a 88 y.o. female with medical history significant of HTN presenting with a fall.  I spoke with her son.  Her daughter was there when she fell and called 911.  She had fallen recently (a week or so ago) and they were making plans to monitor her.  Her heart rate was up.  She has been a bit confused lately.  She was cutting her grass until recently but has gone downhill quickly in the last few weeks.  No apparent recent illness.  She does have chronic insomnia.  She has not been eating well.  She does not have a h/o afib.  Her son had a recent stroke but she has no indication of stroke  per his recall.  He is concerned about myocarditis.  She has been very self-sufficient but they are concerned about her driving.  They think she would like a full treatment course other than DNR.  She was diagnosed with afib with RVR, fever and abnormal head CT.  Chest xray was showing no acute cardiopulmonary disease.  CT of the head was showing No acute intracranial hemorrhage.  No skull fracture.  2. Low-density with lack of gray-white differentiation in the right frontal lobe is consistent with age indeterminate ischemia, but new from 2021 exam. Consider MRI for further assessment as clinically indicated.  3. Background atrophy and chronic small vessel ischemic change. ?  ?   ?SLP Plan ? Discharge SLP treatment due to (comment) (discharging home with Hospice) ? ?  ?  ?Recommendations for follow up therapy are one component of a multi-disciplinary discharge planning process, led by the attending physician.  Recommendations may be updated based on patient status, additional functional criteria and insurance authorization. ?  ? ?Recommendations  ?Diet recommendations: Thin liquid;Dysphagia 3 (mechanical soft) ?Liquids provided via: Cup;Straw ?Medication Administration: Crushed with puree ?Supervision: Full supervision/cueing for compensatory strategies;Staff to assist with self feeding ?Compensations: Minimize environmental distractions;Slow rate;Small sips/bites ?Postural Changes and/or Swallow Maneuvers: Seated upright 90 degrees  ?   ?    ?   ? ? ? ? Oral Care Recommendations: Oral care BID;Staff/trained caregiver to provide oral care ?Follow Up Recommendations: No SLP follow up ?Assistance recommended at discharge: None ?SLP Visit Diagnosis: Dysphagia,  unspecified (R13.10) ?Plan: Discharge SLP treatment due to (comment) (discharging home with Hospice) ? ? ? ? ?  ?  ? ? ?Sonia Baller, MA, CCC-SLP ?Speech Therapy ? ?

## 2021-11-06 NOTE — Consult Note (Signed)
? ?Palliative Care Consult Note  ?                                ?Date: 11/06/2021  ? ?Patient Name: [name]  ?DOB: 11/09/1921  MRN: [id number]  Age / Sex: 88 y.o., female  ?PCP: Alroy Dust, L.Marlou Sa, MD ?Referring Physician: Thurnell Lose, MD ? ?Reason for Consultation: Establishing goals of care ? ?HPI/Patient Profile: 88 y.o. female  with past medical history of hypertension and hyperlipidemia who presented to the [facility] emergency department on 11/01/2021 with after having a fall.  In the ED she was found to be in new onset A-fib with RVR and was started on a diltiazem drip.  She was febrile with Tmax 101 but without obvious source of infection.  Chest x-ray negative, UA negative, and daughter declined lumbar puncture.  CT head negative for acute changes but did show age indeterminate frontal lobe low-density area suspicious for infarct.  She was transferred to [facility] for further evaluation and management.  Admitted to hospitalist service. ? ? ?Past Medical History:  ?Diagnosis Date  ?? Atrial fibrillation ([place]) 11/02/2021  ?? Hypertension   ?? Spinal stenosis   ? ? ?Subjective:  ? ?I have reviewed medical records including EPIC notes, labs and imaging, received report from the team, and assessed the patient at bedside.  She is resting comfortably  and I did not attempt to wake her. ? ?I spoke with her daughter [name] by phone to discuss diagnosis, prognosis, GOC, EOL wishes, disposition, and options. ? ?I introduced Palliative Medicine as specialized medical care for people living with serious illness. It focuses on providing relief from the symptoms and stress of a serious illness.  ? ?[name] tells me that [name] has always been very active and independent.  She was still mowing her yard and doing yard work as recently as last summer.  She is widowed.  She had 4 children; unfortunately 2 are deceased. ? ?We discussed patient's current illness and what it  means in the larger context of his/her ongoing co-morbidities. Current clinical status was reviewed. Natural trajectory at end-of-life was discussed.  Discussed patient's poor oral intake and that this is often a sign of approaching end-of-life. ? ?[name] tells me that her mother seems to declined over the past several weeks.  She has not been eating much and has had 15 to 20 pounds of unintentional weight loss.  She has also been falling frequently at home. ? ?Created space and opportunity for patient and family to explore thoughts and feelings regarding current medical situation.  Discussed that original plan was for rehab with the goal of improving functional status.  However family ultimately decided they would prefer to bring patient home with hospice.  Their goal is to keep her comfortable and allow her to be at home for end-of-life. ? ?The difference between full scope medical intervention and comfort care was considered. Reviewed the concept of a comfort path to family, emphasizing that this path involves de-escalating and stopping full scope medical interventions, allowing a natural course to occur. Discussed that the goal is comfort and dignity rather than cure/prolonging life.  ? ?Provided education and counseling on the philosophy and benefits of hospice care. Discussed that it offers a holistic approach to care in the setting of end-stage illness/disease, and is about supporting the patient where they are while allowing the natural course to occur.  Discussed that the goal of  hospice is to provide improved quality of life as well as to promote comfort and dignity.  They can provide personal care, support for the family, and help keep patient out of the hospital. ? ?A discussion was had today regarding advanced directives. Concepts specific to code status, artifical feeding and hydration, continued IV antibiotics and rehospitalization was had.  [name] definitely wants to avoid having her mother  rehospitalized if at all possible.  However she would consider treating an infection with antibiotics if needed. ? ?Questions and concerns addressed. Patient/family encouraged to call with questions or concerns.   ? ? ? ?Review of Systems  ?Unable to perform ROS: Other  ? ?Objective:  ? ?Primary Diagnoses: ?Present on Admission: ?? (Resolved) Sepsis ([place]) ?? Atrial fibrillation with RVR ([place]) ?? Fever ?? Essential hypertension ?? Thoracic aortic aneurysm without rupture ([place]) ?? Abnormal head CT ?? Elevated troponin ?? Pressure ulcer ?? Hypernatremia ?? Hypokalemia ?? Dehydration ? ? ?Physical Exam ?Vitals reviewed.  ?Constitutional:   ?   General: She is sleeping. She is not in acute distress. ?   Appearance: She is ill-appearing.  ?   Comments: Appears frail  ?Pulmonary:  ?   Effort: Pulmonary effort is normal.  ? ? ?Vital Signs:  ?BP (!) 149/57 (BP Location: Left Arm)   Pulse 74   Temp 98 ?F (36.7 ?C) (Oral)   Resp 16   Ht 5\' 4"  (1.626 m)   Wt 57.9 kg   SpO2 98%   BMI 21.91 kg/m?  ? ?Palliative Assessment/Data: PPS 30% ? ? ? ? ?Assessment & Plan:  ? ?SUMMARY OF RECOMMENDATIONS   ?DNR/DNI as previously documented ?Continue current interventions ?Goal is home with hospice - [facility] ?Family wishes to avoid rehospitalization  ?PMT will continue to follow ? ?Primary Decision Maker: ?Daughter [name] and son Fritz Pickerel ? ?Prognosis:  ?< 6 months ? ?Discharge Planning:  ?Home with Hospice  ? ? ? ?Thank you for allowing [place] to participate in the care of [name] ? ? ?MDM - [place] ? ? ?Signed by: ?Elie Confer, NP ?Palliative Medicine Team ? ?Team Phone # [phone]  ?For individual providers, please see AMION ? ? ? ? ? ? ? ? ? ? ? ? ? ? ? ?

## 2021-11-06 NOTE — Progress Notes (Signed)
Physical Therapy Treatment ?Patient Details ?Name: [name] ?MRN: [id number] ?DOB: Dec 25, 1921 ?Today's Date: 11/06/2021 ? ? ?History of Present Illness 88 y/o female presented on 11/01/21 after unwitnessed fall. New onset of Afib with RVR in ED. CT with concern for stroke with ischemic changes. MRI +rt frontal CVA   PMH: HTN ? ?  ?PT Comments  ? ? Patient trying to pull herself to long-sitting using bil rails on arrival. Much more cooperative with mobility with PT compared to on evaluation. Able to come to stand and walk 12 ft with RW with min assist. She has shuffling gait with LLE dragging behind and leading with RLE. Difficult time following purely verbal instructions, but doing well with gestures.  ?   ?Recommendations for follow up therapy are one component of a multi-disciplinary discharge planning process, led by the attending physician.  Recommendations may be updated based on patient status, additional functional criteria and insurance authorization. ? ?Follow Up Recommendations ? Skilled nursing-short term rehab (<3 hours/day) ?  ?  ?Assistance Recommended at Discharge Frequent or constant Supervision/Assistance  ?Patient can return home with the following Assistance with cooking/housework;Direct supervision/assist for medications management;Assist for transportation;Direct supervision/assist for financial management;Help with stairs or ramp for entrance;A little help with walking and/or transfers;A lot of help with bathing/dressing/bathroom ?  ?Equipment Recommendations ? Other (comment) (TBD)  ?  ?Recommendations for Other Services   ? ? ?  ?Precautions / Restrictions Precautions ?Precautions: Fall ?Restrictions ?Weight Bearing Restrictions: No  ?  ? ?Mobility ? Bed Mobility ?Overal bed mobility: Needs Assistance ?Bed Mobility: Supine to Sit, Sit to Supine ?  ?  ?Supine to sit: Min assist, HOB elevated ?Sit to supine: Min assist ?  ?General bed mobility comments: pt responding to gestures and ?vc;  assist to raise torso and scoot hips out to EOB; assist to raise LLE up onto bed ?  ? ?Transfers ?Overall transfer level: Needs assistance ?Equipment used: Rolling walker (2 wheels) ?Transfers: Sit to/from Stand ?Sit to Stand: Min assist ?  ?  ?  ?  ?  ?General transfer comment: pt pulls up on RW with it stabilized by PT; ?able to hear command to push off bed; did reach back for bed with stand to sit ?  ? ?Ambulation/Gait ?Ambulation/Gait assistance: Min assist, +2 safety/equipment ?Gait Distance (Feet): 12 Feet ?Assistive device: Rolling walker (2 wheels) ?Gait Pattern/deviations: Step-to pattern, Decreased step length - left, Decreased dorsiflexion - left, Shuffle ?  ?  ?  ?General Gait Details: left leg lagging behind and does not fully "meet" RLE as she performs "step-to" pattern ? ? ?Stairs ?  ?  ?  ?  ?  ? ? ?Wheelchair Mobility ?  ? ?Modified Rankin (Stroke Patients Only) ?  ? ? ?  ?Balance Overall balance assessment: Needs assistance ?Sitting-balance support: No upper extremity supported, Feet supported ?Sitting balance-Leahy Scale: Fair ?  ?  ?Standing balance support: Bilateral upper extremity supported ?Standing balance-Leahy Scale: Poor ?  ?  ?  ?  ?  ?  ?  ?  ?  ?  ?  ?  ?  ? ?  ?Cognition Arousal/Alertness: Awake/alert ?Behavior During Therapy: [facility] for tasks assessed/performed ?Overall Cognitive Status: Difficult to assess ?Area of Impairment: Following commands ?  ?  ?  ?  ?  ?  ?  ?  ?  ?  ?  ?Following Commands: Follows one step commands with increased time ?  ?  ?  ?  ?  ?  ? ?  ?  Exercises   ? ?  ?General Comments General comments (skin integrity, edema, etc.): Patient more cooperative and understanding both gestures and ?vc's for mobility. ?  ?  ? ?Pertinent Vitals/Pain Pain Assessment ?Pain Assessment: PAINAD ?Breathing: normal ?Negative Vocalization: none ?Facial Expression: smiling or inexpressive ?Body Language: relaxed ?Consolability: no need to console ?PAINAD Score: 0  ? ? ?Home Living   ?   ?  ?  ?  ?  ?  ?  ?  ?  ?   ?  ?Prior Function    ?  ?  ?   ? ?PT Goals (current goals can now be found in the care plan section) Acute Rehab PT Goals ?Patient Stated Goal: did not state ?Time For Goal Achievement: 11/17/21 ?Potential to Achieve Goals: Fair ?Progress towards PT goals: Progressing toward goals ? ?  ?Frequency ? ? ? Min 2X/week ? ? ? ?  ?PT Plan Current plan remains appropriate  ? ? ?Co-evaluation   ?  ?  ?  ?  ? ?  ?AM-PAC PT "6 Clicks" Mobility   ?Outcome Measure ? Help needed turning from your back to your side while in a flat bed without using bedrails?: A Lot ?Help needed moving from lying on your back to sitting on the side of a flat bed without using bedrails?: A Lot ?Help needed moving to and from a bed to a chair (including a wheelchair)?: A Little ?Help needed standing up from a chair using your arms (e.g., wheelchair or bedside chair)?: A Little ?Help needed to walk in hospital room?: Total ?Help needed climbing 3-5 steps with a railing? : Total ?6 Click Score: 12 ? ?  ?End of Session Equipment Utilized During Treatment: Gait belt ?Activity Tolerance: Patient tolerated treatment well ?Patient left: in bed;with call bell/phone within reach;with bed alarm set ?Nurse Communication: Mobility status ?PT Visit Diagnosis: Unsteadiness on feet (R26.81);Muscle weakness (generalized) (M62.81);History of falling (Z91.81) ?  ? ? ?Time: 6144-3154 ?PT Time Calculation (min) (ACUTE ONLY): 19 min ? ?Charges:  $Gait Training: 8-22 mins          ?          ? ? ?[name], PT ?Acute Rehabilitation Services  ?Pager [phone] ?Office [phone] ? ? ? ?[name] [name] ?11/06/2021, 11:22 AM ? ?

## 2021-11-07 DIAGNOSIS — I4891 Unspecified atrial fibrillation: Secondary | ICD-10-CM | POA: Diagnosis not present

## 2021-11-07 DIAGNOSIS — R296 Repeated falls: Secondary | ICD-10-CM | POA: Diagnosis not present

## 2021-11-07 NOTE — Progress Notes (Addendum)
Occupational Therapy Treatment ?Patient Details ?Name: [name] ?MRN: [id number] ?DOB: 07/26/1921 ?Today's Date: 11/07/2021 ? ? ?History of present illness 88 y/o female presented on 11/01/21 after unwitnessed fall. New onset of Afib with RVR in ED. CT with concern for stroke with ischemic changes. MRI +rt frontal CVA   PMH: HTN ?  ?OT comments ? Session focused on family education on DME use, task modification and home environment modifications based on plan to DC home with hospice care. Pt's daughter receptive to all education, working hard to modify home for optimal care. Educated on maintenance of functional abilities with transfers/ADLs while also considering functional decline impacting ADL care with strategies to alter assist levels. See ADL section for details.   ? ?Recommendations for follow up therapy are one component of a multi-disciplinary discharge planning process, led by the attending physician.  Recommendations may be updated based on patient status, additional functional criteria and insurance authorization. ?   ?Follow Up Recommendations ? Other (comment) (home with hospice)  ?  ?Assistance Recommended at Discharge Frequent or constant Supervision/Assistance  ?Patient can return home with the following ? A little help with walking and/or transfers;A lot of help with bathing/dressing/bathroom;Assistance with feeding;Direct supervision/assist for medications management;Help with stairs or ramp for entrance;Assist for transportation ?  ?Equipment Recommendations ? Wheelchair (measurements OT);Wheelchair cushion (measurements OT);BSC/3in1;Other (comment) (daughter considering hospital bed but pt sleeps in chair at baseline)  ?  ?Recommendations for Other Services   ? ?  ?Precautions / Restrictions Precautions ?Precautions: Fall ?Restrictions ?Weight Bearing Restrictions: No  ? ? ?  ? ?   ?   ? ?ADL either performed or assessed with clinical judgement  ? ?ADL Overall ADL's : Needs  assistance/impaired ?  ?  ?  ?  ?  ?  ?  ?  ?  ?  ?  ?  ?  ?  ?  ?  ?  ?  ?  ?General ADL Comments: Emphasis on ADL completion with current abilities of limited assist transfers. educated daughter on bed level ADLs if declined to this level and likely benefits of hospital bed if mobility declines. Educated on use of BSC if unable to walk to bathroom, wheelchair for longer distances. Daughter reports working hard to rearrange pt's home and ensure needed equipment in place. ?  ? ?Extremity/Trunk Assessment Upper Extremity Assessment ?Upper Extremity Assessment: Generalized weakness ?  ?Lower Extremity Assessment ?Lower Extremity Assessment: Defer to PT evaluation ?  ?  ?  ? ?Vision   ?Vision Assessment?: No apparent visual deficits ?  ?Perception   ?  ?Praxis   ?  ? ?Cognition Arousal/Alertness: Awake/alert ?Behavior During Therapy: [facility] for tasks assessed/performed ?Overall Cognitive Status: Impaired/Different from baseline ?Area of Impairment: Following commands, Memory, Attention, Orientation ?  ?  ?  ?  ?  ?  ?  ?  ?Orientation Level: Disoriented to, Place, Time, Situation ?Current Attention Level: Focused ?Memory: Decreased short-term memory ?Following Commands: Follows one step commands with increased time, Follows one step commands inconsistently ?  ?  ?  ?General Comments: inconsistent following of commands, poor short term memory as pt repetitvely asking when she can go home. Decreased attention to tasks though also impaired by [facility] ?  ?  ?   ?Exercises   ? ?  ?Shoulder Instructions   ? ? ?  ?General Comments    ? ? ?Pertinent Vitals/ Pain       Pain Assessment ?Pain Assessment: Faces ?Faces Pain Scale: No hurt ? ?  Home Living   ?  ?  ?  ?  ?  ?  ?  ?  ?  ?  ?  ?  ?  ?  ?  ?  ?  ?  ? ?  ?Prior Functioning/Environment    ?  ?  ?  ?   ? ?Frequency ? Min 2X/week  ? ? ? ? ?  ?Progress Toward Goals ? ?OT Goals(current goals can now be found in the care plan section) ? Progress towards OT goals: OT to reassess next  treatment ? ?Acute Rehab OT Goals ?Patient Stated Goal: daughter ready for pt to DC home friday ?OT Goal Formulation: With patient/family ?Time For Goal Achievement: 11/17/21 ?Potential to Achieve Goals: Fair ?ADL Goals ?Pt Will Perform Eating: with set-up;sitting ?Pt Will Perform Grooming: with set-up;sitting ?Pt Will Perform Lower Body Bathing: with min guard assist;sitting/lateral leans;sit to/from stand ?Pt Will Perform Lower Body Dressing: with min guard assist;sitting/lateral leans;sit to/from stand ?Pt Will Transfer to Toilet: with mod assist;ambulating ?Pt Will Perform Toileting - Clothing Manipulation and hygiene: with min guard assist;sitting/lateral leans;sit to/from stand  ?Plan Discharge plan needs to be updated   ? ?Co-evaluation ? ? ?   ?  ?  ?  ?  ? ?  ?AM-PAC OT "6 Clicks" Daily Activity     ?Outcome Measure ? ? Help from another person eating meals?: A Little ?Help from another person taking care of personal grooming?: A Lot ?Help from another person toileting, which includes using toliet, bedpan, or urinal?: A Lot ?Help from another person bathing (including washing, rinsing, drying)?: A Lot ?Help from another person to put on and taking off regular upper body clothing?: A Lot ?Help from another person to put on and taking off regular lower body clothing?: A Lot ?6 Click Score: 13 ? ?  ?End of Session   ? ?OT Visit Diagnosis: Unsteadiness on feet (R26.81);Other abnormalities of gait and mobility (R26.89);Muscle weakness (generalized) (M62.81);Other symptoms and signs involving cognitive function ?  ?Activity Tolerance Patient tolerated treatment well ?  ?Patient Left in bed;with call bell/phone within reach;with bed alarm set;with family/visitor present ?  ?Nurse Communication Mobility status;Other (comment) (DC requests) ?  ? ?   ? ?Time: [id number] ?OT Time Calculation (min): 17 min ? ?Charges: OT General Charges ?$OT Visit: 1 Visit ?OT Treatments ?$Therapeutic Activity: 8-22 mins ? ?Malachy Chamber,  OTR/L ?Acute Rehab Services ?Office: [phone]  ? ?[name] ?11/07/2021, 3:04 PM ?

## 2021-11-07 NOTE — Progress Notes (Signed)
The pt has her foley removed around 5 pm. She has not urinated since then. Bladder scan at [facility] read zero. Repeated this morning read 22 mL. Notified Dr. Julian Reil and he indicated the bladder scan may not be accurate so I should do in and out cath.  ?

## 2021-11-07 NOTE — Progress Notes (Signed)
?                                  PROGRESS NOTE                                             ?                                                                                                                     ?                                         ? ? Patient Demographics:  ? ? [name], is a 88 y.o. female, DOB - [birth year], [id number] ? ?Outpatient Primary MD for the patient is Alroy Dust, L.Marlou Sa, MD    LOS - 5  Admit date - 11/01/2021   ? ?Chief Complaint  ?Patient presents with  ? Fall  ?    ? ?Brief Narrative (HPI from H&P)   ?- 88 y.o. female with medical history significant of HTN presenting with a fall.  I spoke with her son.  Her daughter was there when she fell and called 911, cording to the family members she has had a few falls over the past several weeks which is unusual for her, she has also had a poor appetite and had 15 to 20 pounds of unintentional weight loss.  She was brought to the ER for this present fall where she was found to have A-fib with RVR, ED did not show any acute changes but there was a age indeterminant frontal lobe low-density area suspicious for infarct. ? ? Subjective:  ? ?She was able to urinate, bladder scan with no evidence of further retention is unable to provide any reliable history. ? ? Assessment  & Plan :  ? ? 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  Likely due to underlying A-fib RVR, it was decided at the time of admission by the cardiology team and family that patient will not receive anticoagulation due to her advanced age and fall risk.  Discussed patient's clinical scenario in detail with family, they do not want heroics or full stroke work-up, they only want gentle medical treatment with medications, aspirin and statin will be continued for secondary prevention.  No  further work-up will be done as management will not change.  Stroke most likely is embolic and she is not anticoagulation candidate.  Continue supportive care, long-term outcome appears poor family is open for hospice.  Palliative medicine consulted, family now wishes to take her home with hospice. ? ? ?2.  New onset  A-fib RVR.  Mali vas 2 score is greater than 3.  However poor candidate for anticoagulation due to multiple falls and advanced age as decided by cardiology, seen by cardiology, was on Cardizem drip now transition to oral Lopressor along with as needed IV Cardizem and IV Lopressor as needed.  Echocardiogram noted with stable TSH. ? ?3.  Urinary retention with abdominal distention.  CT abdomen pelvis nonacute.  Bladder scan did show more than 350 cc of urine, Foley and Flomax ,, had constipation with stool burden as well.  Improved after bowel regimen.  ?-Foley catheter discontinued yesterday, continue to monitor for retention ? ?4.  Fever at the time of admission.  Of note daughter declined LP at the time of admission, CT abdomen pelvis nonacute, UA unremarkable, at risk for aspiration pneumonia.  For now place her on Unasyn and monitor. ? ?5.  Thoracic aneurysm.  4.8 cm, due to her age not a candidate for repair, beta-blocker and monitor.   ? ?6.  Dehydration with hypernatremia and hypokalemia.  IV D5W and replace potassium, monitor.   ? ?7. Multiple falls, failure to thrive, unintentional weight loss and poor appetite and insomnia.  PT, OT and speech eval.  Remeron for stimulating appetite and help with insomnia.  Will qualify for SNF but family now wishes to take home with hospice. ? ?8.  Aortic regurgitation on echocardiogram.  Supportive care. ? ?   ? ?Condition - Extremely Guarded ? ?Family Communication  :  None at bedside ? ?Code Status :  DNR ? ?Consults  :  Cards ? ?PUD Prophylaxis :   ? ? Procedures  :    ? ?MRI -  IMPRESSION: ?1. Evolving subacute nonhemorrhagic ischemic infarct involving the  right frontal operculum, corresponding with abnormality on prior CT. Additional scattered small volume subacute ischemic nonhemorrhagic infarcts involving the bilateral cerebral hemispheres, right pons and brachium pontis, and right cerebellum as above. ?2. Irregular flow voids within both vertebral arteries as they course into the cranial vault, which could be related to slow flow ?and/or occlusion. ?3. Age-related cerebral atrophy with moderate chronic microvascular ischemic disease. ?4. Right sphenoid sinusitis. ? ?TTE -  ? ? 1. Limited echo due to uncooperative patient.  ? 2. Left ventricular ejection fraction, by estimation, is 60 to 65%. The  ?left ventricle has normal function. The left ventricle has no regional  ?wall motion abnormalities. There is severe concentric left ventricular  ?hypertrophy. Diastolic function  ?indeterminant due to severe MAC.  ? 3. Right ventricular systolic function is normal. The right ventricular  ?size is normal.  ? 4. Left atrial size was moderately dilated.  ? 5. The mitral valve is abnormal. Severe leaflet thickening and  ?calcification. Trivial mitral valve regurgitation. Severe mitral annular  ?calcification.  ? 6. The aortic valve is tricuspid. There is moderate calcification of the  ?aortic valve. There is moderate thickening of the aortic valve. Aortic  ?valve regurgitation is severe.  ? 7. Aortic dilatation noted. There is moderate dilatation of the ascending  ?aorta, measuring 48 mm. ? ? ? ?CT head -  1. No acute intracranial hemorrhage.  No skull fracture. 2. Low-density with lack of gray-white differentiation in the right frontal lobe is consistent with age indeterminate ischemia, but new from 2021 exam. Consider MRI for further assessment as clinically indicated. 3. Background atrophy and chronic small vessel ischemic change. ? ?CT -  1.  Limited exam due to patient's motion. 2. Within the limitations no CT evidence of  acute visceral or vascular injury in the chest,  abdomen or pelvis. 3. Aneurysmal dilatation of the ascending thoracic aorta measuring up to 4.8 cm. 4. Degenerate disc disease of the thoracolumbar spine. No acute fracture of the spine was noted. Evaluation of pelvis is significantly limited due to motion, a follow-up radiograph would be helpful if clinically warranted. 5. Advanced atherosclerotic disease of abdominal aorta and branch vessels. 6.  Cholelithiasis without evidence of acute cholecystitis.  ? ?   ? ?Disposition Plan  :   ? ?Status is: Inpatient ? ?DVT Prophylaxis  :   ? ?enoxaparin (LOVENOX) injection 40 mg Start: 11/02/21 1615 ? ?Lab Results  ?Component Value Date  ? PLT 240 11/06/2021  ? ? ?Diet :  ?Diet Order   ? ?       ?  Diet Heart Room service appropriate? Yes; Fluid consistency: Thin  Diet effective now       ?  ? ?  ?  ? ?  ?  ? ?Inpatient Medications ? ?Scheduled Meds: ? aspirin  81 mg Oral Daily  ? bisacodyl  10 mg Rectal Once  ? Chlorhexidine Gluconate Cloth  6 each Topical Daily  ? docusate sodium  200 mg Oral BID  ? enoxaparin (LOVENOX) injection  40 mg Subcutaneous Q24H  ? ezetimibe  10 mg Oral Daily  ? metoprolol tartrate  50 mg Oral BID  ? mirtazapine  15 mg Oral QHS  ? polyethylene glycol  17 g Oral Daily  ? sodium chloride flush  3 mL Intravenous Q12H  ? ?Continuous Infusions: ? ampicillin-sulbactam (UNASYN) IV 3 g (11/07/21 1142)  ? ?PRN Meds:.acetaminophen **OR** acetaminophen, diltiazem, hydrALAZINE, metoprolol tartrate, ondansetron **OR** ondansetron (ZOFRAN) IV ? ?Antibiotics  :   ? ?Anti-infectives (From admission, onward)  ? ? Start     Dose/Rate Route Frequency Ordered Stop  ? 11/03/21 1130  Ampicillin-Sulbactam (UNASYN) 3 g in sodium chloride 0.9 % 100 mL IVPB       ? 3 g ?200 mL/hr over 30 Minutes Intravenous Every 8 hours 11/03/21 1040    ? 11/01/21 1915  vancomycin (VANCOCIN) IVPB 1000 mg/200 mL premix       ? 1,000 mg ?200 mL/hr over 60 Minutes Intravenous  Once 11/01/21 1909 11/01/21 2253  ? 11/01/21 1745  cefTRIAXone  (ROCEPHIN) 1 g in sodium chloride 0.9 % 100 mL IVPB       ? 1 g ?200 mL/hr over 30 Minutes Intravenous  Once 11/01/21 1736 11/01/21 1917  ? ?  ? ? ? ? ?Phillips Climes M.D on 11/07/2021 at 4:02 PM ? ?To p

## 2021-11-07 NOTE — Progress Notes (Signed)
[facility] ([facility] Hospital Liaison Note ? ?[facility] will continue to follow through discharge. Please call with any questions or concerns. Thank you ? ? ?Shanita Wicker, LCSW ?[facility] Hospital Liaison ?[phone] ?

## 2021-11-07 NOTE — Progress Notes (Signed)
In and out cath done and received 200 mL of clear yellow urine. Patient tolerated well.  ?

## 2021-11-07 NOTE — Plan of Care (Signed)
?  Problem: Education: ?Goal: Knowledge of disease or condition will improve ?Outcome: Progressing ?Goal: Understanding of medication regimen will improve ?Outcome: Progressing ?Goal: Individualized Educational Video(s) ?Outcome: Progressing ?  ?Problem: Activity: ?Goal: Ability to tolerate increased activity will improve ?Outcome: Progressing ?  ?Problem: Cardiac: ?Goal: Ability to achieve and maintain adequate cardiopulmonary perfusion will improve ?Outcome: Progressing ?  ?Problem: Health Behavior/Discharge Planning: ?Goal: Ability to safely manage health-related needs after discharge will improve ?Outcome: Progressing ?  ?Problem: Education: ?Goal: Knowledge of General Education information will improve ?Description: Including pain rating scale, medication(s)/side effects and non-pharmacologic comfort measures ?Outcome: Progressing ?  ?Problem: Health Behavior/Discharge Planning: ?Goal: Ability to manage health-related needs will improve ?Outcome: Progressing ?  ?Problem: Clinical Measurements: ?Goal: Ability to maintain clinical measurements within normal limits will improve ?Outcome: Progressing ?Goal: Will remain free from infection ?Outcome: Progressing ?Goal: Diagnostic test results will improve ?Outcome: Progressing ?Goal: Respiratory complications will improve ?Outcome: Progressing ?Goal: Cardiovascular complication will be avoided ?Outcome: Progressing ?  ?Problem: Activity: ?Goal: Risk for activity intolerance will decrease ?Outcome: Progressing ?  ?Problem: Nutrition: ?Goal: Adequate nutrition will be maintained ?Outcome: Progressing ?  ?Problem: Coping: ?Goal: Level of anxiety will decrease ?Outcome: Progressing ?  ?Problem: Elimination: ?Goal: Will not experience complications related to bowel motility ?Outcome: Progressing ?Goal: Will not experience complications related to urinary retention ?Outcome: Progressing ?  ?Problem: Pain Managment: ?Goal: General experience of comfort will improve ?Outcome:  Progressing ?  ?Problem: Safety: ?Goal: Ability to remain free from injury will improve ?Outcome: Progressing ?  ?Problem: Skin Integrity: ?Goal: Risk for impaired skin integrity will decrease ?Outcome: Progressing ?  ?Problem: Fluid Volume: ?Goal: Hemodynamic stability will improve ?Outcome: Progressing ?  ?Problem: Clinical Measurements: ?Goal: Diagnostic test results will improve ?Outcome: Progressing ?Goal: Signs and symptoms of infection will decrease ?Outcome: Progressing ?  ?Problem: Respiratory: ?Goal: Ability to maintain adequate ventilation will improve ?Outcome: Progressing ?  ?

## 2021-11-07 NOTE — TOC Transition Note (Signed)
Transition of Care (TOC) - CM/SW Discharge Note ? ? ?Patient Details  ?Name: ROBIE OATS ?MRN: [id number] ?Date of Birth: 1922-05-09 ? ?Transition of Care (TOC) CM/SW Contact:  ?Harriet Masson, RN ?Phone Number: ?11/07/2021, 3:21 PM ? ? ?Clinical Narrative:    ?Spoke with daughter, [name] regarding discharge today. [name] states her mother's house  won't be ready for her until Friday. The house is having plumbing issues and furniture needs to be moved. Notified MD of discharge delay reasons. ?Daughter would like to transport patient home Friday am. ?TOC will continue to follow ? ?  ?Barriers to Discharge:  (Family getting home ready; will need Hospice DME) ? ? ?Patient Goals and CMS Choice ?Patient states their goals for this hospitalization and ongoing recovery are:: Comfort ?CMS Medicare.gov Compare Post Acute Care list provided to:: Patient Represenative (must comment) ?Choice offered to / list presented to : Adult Children ? ?Discharge Placement ?  ?           ? home ?  ?  ?  ? ?Discharge Plan and Services ?In-house Referral: Clinical Social Work ?  ?Post Acute Care Choice: Hospice          ?  ?  ?  ?  ?  ?  ?  ?  ?  ?  ? ?Social Determinants of Health (SDOH) Interventions ?  ? ? ?Readmission Risk Interventions ?   ? View : No data to display.  ?  ?  ?  ? ? ? ? ? ?

## 2021-11-08 DIAGNOSIS — R296 Repeated falls: Secondary | ICD-10-CM | POA: Diagnosis not present

## 2021-11-08 DIAGNOSIS — I639 Cerebral infarction, unspecified: Secondary | ICD-10-CM

## 2021-11-08 DIAGNOSIS — I4891 Unspecified atrial fibrillation: Secondary | ICD-10-CM | POA: Diagnosis not present

## 2021-11-08 NOTE — Progress Notes (Signed)
Physical Therapy Treatment ?Patient Details ?Name: [name] ?MRN: [id number] ?DOB: 14-Apr-1922 ?Today's Date: 11/08/2021 ? ? ?History of Present Illness 88 y/o female presented on 11/01/21 after unwitnessed fall. New onset of Afib with RVR in ED. CT with concern for stroke with ischemic changes. MRI +rt frontal CVA   PMH: HTN ? ?  ?PT Comments  ? ? Attempted to see patient with family present multiple times today with no family here. Attempted to get pt to mobilize for maintaining strength and she refused OOB (pulling covers back over her when attempted to remove them). She did participate in leg exercises in supine with PROM initial reps and then pt continuing with AROM or AAROM. Patient remained in bed with alarm at end of session.  ?   ?Recommendations for follow up therapy are one component of a multi-disciplinary discharge planning process, led by the attending physician.  Recommendations may be updated based on patient status, additional functional criteria and insurance authorization. ? ?Follow Up Recommendations ? No PT follow up (family taking pt home with hospice) ?  ?  ?Assistance Recommended at Discharge Frequent or constant Supervision/Assistance  ?Patient can return home with the following Assistance with cooking/housework;Direct supervision/assist for medications management;Assist for transportation;Direct supervision/assist for financial management;Help with stairs or ramp for entrance;A little help with walking and/or transfers;A lot of help with bathing/dressing/bathroom ?  ?Equipment Recommendations ? Hospital bed;Wheelchair (measurements PT);Wheelchair cushion (measurements PT)  ?  ?Recommendations for Other Services   ? ? ?  ?Precautions / Restrictions Precautions ?Precautions: Fall ?Precaution Comments: mittens ?Restrictions ?Weight Bearing Restrictions: No  ?  ? ?Mobility ? Bed Mobility ?  ?  ?  ?  ?  ?  ?  ?General bed mobility comments: pt refusing mobility ?  ? ?Transfers ?  ?  ?  ?  ?  ?   ?  ?  ?  ?General transfer comment: pt refusing mobility ?  ? ?Ambulation/Gait ?  ?  ?  ?  ?  ?  ?  ?  ? ? ?Stairs ?  ?  ?  ?  ?  ? ? ?Wheelchair Mobility ?  ? ?Modified Rankin (Stroke Patients Only) ?  ? ? ?  ?Balance   ?  ?  ?  ?  ?  ?  ?  ?  ?  ?  ?  ?  ?  ?  ?  ?  ?  ?  ?  ? ?  ?Cognition Arousal/Alertness: Awake/alert ?Behavior During Therapy: [facility] for tasks assessed/performed ?Overall Cognitive Status: Impaired/Different from baseline ?Area of Impairment: Following commands, Memory, Attention, Orientation ?  ?  ?  ?  ?  ?  ?  ?  ?Orientation Level: Disoriented to, Place, Time, Situation ?Current Attention Level: Focused ?Memory: Decreased short-term memory ?Following Commands: Follows one step commands with increased time, Follows one step commands inconsistently ?  ?Awareness: Intellectual ?  ?General Comments: inconsistent following of commands, poor short term memory as pt repetitvely asking what day it is. Decreased attention to tasks though also impaired by [place] ?  ?  ? ?  ?Exercises General Exercises - Lower Extremity ?Ankle Circles/Pumps: AROM, Both, 10 reps, Supine ?Short Arc Quad: AAROM, Both, 10 reps, Supine ?Heel Slides: AAROM, Both, 10 reps, Supine ?Hip ABduction/ADduction: AAROM, Both, 10 reps, Supine ? ?  ?General Comments   ?  ?  ? ?Pertinent Vitals/Pain Pain Assessment ?Pain Assessment: No/denies pain  ? ? ?Home Living   ?  ?  ?  ?  ?  ?  ?  ?  ?  ?   ?  ?  Prior Function    ?  ?  ?   ? ?PT Goals (current goals can now be found in the care plan section) Acute Rehab PT Goals ?Patient Stated Goal: did not state ?Time For Goal Achievement: 11/17/21 ?Potential to Achieve Goals: Fair ?Progress towards PT goals: Not progressing toward goals - comment (pt refusing mobility; no family present) ? ?  ?Frequency ? ? ? Min 2X/week ? ? ? ?  ?PT Plan Discharge plan needs to be updated (family plans to take pt home with Hospice)  ? ? ?Co-evaluation   ?  ?  ?  ?  ? ?  ?AM-PAC PT "6 Clicks" Mobility   ?Outcome  Measure ? Help needed turning from your back to your side while in a flat bed without using bedrails?: A Lot ?Help needed moving from lying on your back to sitting on the side of a flat bed without using bedrails?: A Lot ?Help needed moving to and from a bed to a chair (including a wheelchair)?: A Little ?Help needed standing up from a chair using your arms (e.g., wheelchair or bedside chair)?: A Little ?Help needed to walk in hospital room?: Total ?Help needed climbing 3-5 steps with a railing? : Total ?6 Click Score: 12 ? ?  ?End of Session   ?Activity Tolerance: Patient tolerated treatment well ?Patient left: in bed;with call bell/phone within reach;with bed alarm set ?  ?PT Visit Diagnosis: Unsteadiness on feet (R26.81);Muscle weakness (generalized) (M62.81);History of falling (Z91.81) ?  ? ? ?Time: [id number] ?PT Time Calculation (min) (ACUTE ONLY): 12 min ? ?Charges:  $Therapeutic Exercise: 8-22 mins          ?          ? ? ?Arby Barrette, PT ?Acute Rehabilitation Services  ?Pager [phone] ?Office [phone] ? ? ? ?Jeanie Cooks Johnnetta Holstine ?11/08/2021, 3:24 PM ? ?

## 2021-11-08 NOTE — Progress Notes (Signed)
Spoke with daughter Darl Pikes via phone. Concerns of what time discharge will be tomorrow. Stated that discharge orders have not been placed as of yet and arranged to notify her when orders are placed. ?

## 2021-11-08 NOTE — Progress Notes (Signed)
?                                  PROGRESS NOTE                                             ?                                                                                                                     ?                                         ? ? Patient Demographics:  ? ? [name], is a 88 y.o. female, DOB - [birth year], CXK:[id number] ? ?Outpatient Primary MD for the patient is Alroy Dust, L.Marlou Sa, MD    LOS - 6  Admit date - 11/01/2021   ? ?Chief Complaint  ?Patient presents with  ? Fall  ?    ? ?Brief Narrative (HPI from H&P)   ?- 88 y.o. female with medical history significant of HTN presenting with a fall.  I spoke with her son.  Her daughter was there when she fell and called 911, cording to the family members she has had a few falls over the past several weeks which is unusual for her, she has also had a poor appetite and had 15 to 20 pounds of unintentional weight loss.  She was brought to the ER for this present fall where she was found to have A-fib with RVR, ED did not show any acute changes but there was a age indeterminant frontal lobe low-density area suspicious for infarct. ? ? Subjective:  ? ?No significant events overnight as discussed with staff, she has been urinating with no evidence of retention, bladder scan is pending for today.   ? ? Assessment  & Plan :  ? 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  ? Likely due to underlying A-fib RVR, it was decided at the time of admission by the cardiology team and family that patient will not receive anticoagulation due to her advanced age and fall risk.  Discussed patient's clinical scenario in detail with family, they do not want heroics or full stroke work-up, they only want gentle medical treatment with medications, aspirin and statin will be continued for  secondary prevention.  No further work-up will be done as management will not change.  Stroke most likely is embolic and she is not anticoagulation candidate.  Continue supportive care, long-term outcome appears poor family is open for hospice.  Palliative medicine consulted, family now wishes to take her home with hospice. ? ? ?  New onset A-fib RVR.  ?- [place] vas 2 score is greater than 3.  However poor candidate for anticoagulation due to multiple falls and advanced age as decided by cardiology, seen by cardiology, was on Cardizem drip now transition to oral Lopressor along with as needed IV Cardizem and IV Lopressor as needed.  Echocardiogram noted with stable TSH. ? ?Urinary retention with abdominal distention.   ?-CT abdomen pelvis nonacute.  Bladder scan did show more than 350 cc of urine, Foley and Flomax ,, had constipation with stool burden as well.  Improved after bowel regimen.  ?-Foley catheter discontinued 4/18,, continue to monitor for retention ? ? Fever at the time of admission.  Of note daughter declined LP at the time of admission, CT abdomen pelvis nonacute, UA unremarkable, at risk for aspiration pneumonia.  For now place her on Unasyn and monitor. ? ?Thoracic aneurysm.  4.8 cm, due to her age not a candidate for repair, beta-blocker and monitor.   ? ?Dehydration with hypernatremia and hypokalemia.  IV D5W and replace potassium, monitor.   ? ?Multiple falls, failure to thrive, unintentional weight loss and poor appetite and insomnia.  PT, OT and speech eval.  Remeron for stimulating appetite and help with insomnia.  Will qualify for SNF but family now wishes to take home with hospice. ? ?Aortic regurgitation on echocardiogram.  Supportive care. ? ?   ? ?Condition - Extremely Guarded ? ?Family Communication  :  None at bedside ? ?Code Status :  DNR ? ?Consults  :  Cards ? ?PUD Prophylaxis :   ? ? Procedures  :    ? ?MRI -  IMPRESSION: ?1. Evolving subacute nonhemorrhagic ischemic infarct involving  the right frontal operculum, corresponding with abnormality on prior CT. Additional scattered small volume subacute ischemic nonhemorrhagic infarcts involving the bilateral cerebral hemispheres, right pons and brachium pontis, and right cerebellum as above. ?2. Irregular flow voids within both vertebral arteries as they course into the cranial vault, which could be related to slow flow ?and/or occlusion. ?3. Age-related cerebral atrophy with moderate chronic microvascular ischemic disease. ?4. Right sphenoid sinusitis. ? ?TTE -  ? ? 1. Limited echo due to uncooperative patient.  ? 2. Left ventricular ejection fraction, by estimation, is 60 to 65%. The  ?left ventricle has normal function. The left ventricle has no regional  ?wall motion abnormalities. There is severe concentric left ventricular  ?hypertrophy. Diastolic function  ?indeterminant due to severe MAC.  ? 3. Right ventricular systolic function is normal. The right ventricular  ?size is normal.  ? 4. Left atrial size was moderately dilated.  ? 5. The mitral valve is abnormal. Severe leaflet thickening and  ?calcification. Trivial mitral valve regurgitation. Severe mitral annular  ?calcification.  ? 6. The aortic valve is tricuspid. There is moderate calcification of the  ?aortic valve. There is moderate thickening of the aortic valve. Aortic  ?valve regurgitation is severe.  ? 7. Aortic dilatation noted. There is moderate dilatation of the ascending  ?aorta, measuring 48 mm. ? ? ? ?CT head -  1. No acute intracranial hemorrhage.  No skull fracture. 2. Low-density with lack of gray-white differentiation in the right frontal lobe is consistent with age indeterminate ischemia, but new from 2021 exam. Consider MRI for further assessment as clinically indicated. 3. Background atrophy and chronic small vessel ischemic change. ? ?CT -  1.  Limited exam due to patient's motion. 2. Within the limitations no CT evidence of acute visceral or vascular injury in  the  chest, abdomen or pelvis. 3. Aneurysmal dilatation of the ascending thoracic aorta measuring up to 4.8 cm. 4. Degenerate disc disease of the thoracolumbar spine. No acute fracture of the spine was noted. Evaluation of pelvis is significantly limited due to motion, a follow-up radiograph would be helpful if clinically warranted. 5. Advanced atherosclerotic disease of abdominal aorta and branch vessels. 6.  Cholelithiasis without evidence of acute cholecystitis.  ? ?   ? ?Disposition Plan  :   ? ?Status is: Inpatient ? ?DVT Prophylaxis  :   ? ?enoxaparin (LOVENOX) injection 40 mg Start: 11/02/21 1615 ? ?Lab Results  ?Component Value Date  ? PLT 240 11/06/2021  ? ? ?Diet :  ?Diet Order   ? ?       ?  Diet Heart Room service appropriate? Yes; Fluid consistency: Thin  Diet effective now       ?  ? ?  ?  ? ?  ?  ? ?Inpatient Medications ? ?Scheduled Meds: ? aspirin  81 mg Oral Daily  ? bisacodyl  10 mg Rectal Once  ? Chlorhexidine Gluconate Cloth  6 each Topical Daily  ? docusate sodium  200 mg Oral BID  ? enoxaparin (LOVENOX) injection  40 mg Subcutaneous Q24H  ? ezetimibe  10 mg Oral Daily  ? metoprolol tartrate  50 mg Oral BID  ? mirtazapine  15 mg Oral QHS  ? polyethylene glycol  17 g Oral Daily  ? sodium chloride flush  3 mL Intravenous Q12H  ? ?Continuous Infusions: ? ampicillin-sulbactam (UNASYN) IV 3 g (11/08/21 1130)  ? ?PRN Meds:.acetaminophen **OR** acetaminophen, diltiazem, hydrALAZINE, metoprolol tartrate, ondansetron **OR** ondansetron (ZOFRAN) IV ? ?Antibiotics  :   ? ?Anti-infectives (From admission, onward)  ? ? Start     Dose/Rate Route Frequency Ordered Stop  ? 11/03/21 1130  Ampicillin-Sulbactam (UNASYN) 3 g in sodium chloride 0.9 % 100 mL IVPB       ? 3 g ?200 mL/hr over 30 Minutes Intravenous Every 8 hours 11/03/21 1040    ? 11/01/21 1915  vancomycin (VANCOCIN) IVPB 1000 mg/200 mL premix       ? 1,000 mg ?200 mL/hr over 60 Minutes Intravenous  Once 11/01/21 1909 11/01/21 2253  ? 11/01/21 1745   cefTRIAXone (ROCEPHIN) 1 g in sodium chloride 0.9 % 100 mL IVPB       ? 1 g ?200 mL/hr over 30 Minutes Intravenous  Once 11/01/21 1736 11/01/21 1917  ? ?  ? ? ? ? ?Phillips Climes M.D on 11/08/2021 at 1:41 PM ?

## 2021-11-09 ENCOUNTER — Other Ambulatory Visit (HOSPITAL_COMMUNITY): Payer: Self-pay

## 2021-11-09 DIAGNOSIS — I4891 Unspecified atrial fibrillation: Secondary | ICD-10-CM | POA: Diagnosis not present

## 2021-11-09 DIAGNOSIS — I712 Thoracic aortic aneurysm, without rupture, unspecified: Secondary | ICD-10-CM

## 2021-11-09 DIAGNOSIS — I6310 Cerebral infarction due to embolism of unspecified precerebral artery: Secondary | ICD-10-CM

## 2021-11-09 MED ORDER — SODIUM CHLORIDE 0.9 % IV BOLUS
500.0000 mL | Freq: Once | INTRAVENOUS | Status: AC
Start: 2021-11-09 — End: 2021-11-09
  Administered 2021-11-09: 500 mL via INTRAVENOUS

## 2021-11-09 MED ORDER — MIRTAZAPINE 15 MG PO TABS
15.0000 mg | ORAL_TABLET | Freq: Every day | ORAL | 0 refills | Status: AC
Start: 2021-11-09 — End: ?
  Filled 2021-11-09: qty 30, 30d supply, fill #0

## 2021-11-09 MED ORDER — DILTIAZEM HCL 25 MG/5ML IV SOLN
10.0000 mg | Freq: Once | INTRAVENOUS | Status: AC
  Administered 2021-11-09: 10 mg via INTRAVENOUS
  Filled 2021-11-09: qty 5

## 2021-11-09 MED ORDER — METOPROLOL SUCCINATE ER 50 MG PO TB24
100.0000 mg | ORAL_TABLET | Freq: Every day | ORAL | 0 refills | Status: AC
  Filled 2021-11-09: qty 60, 30d supply, fill #0

## 2021-11-09 MED ORDER — METOPROLOL TARTRATE 25 MG PO TABS: 75.0000 mg | ORAL_TABLET | Freq: Two times a day (BID) | ORAL | Status: DC

## 2021-11-09 MED ORDER — ASPIRIN 81 MG PO CHEW: 81.0000 mg | CHEWABLE_TABLET | Freq: Every day | ORAL | Status: AC

## 2021-11-09 MED ORDER — ACETAMINOPHEN 325 MG PO TABS: 650.0000 mg | ORAL_TABLET | Freq: Four times a day (QID) | ORAL | Status: AC | PRN

## 2021-11-09 NOTE — Care Management Important Message (Signed)
Important Message ? ?Patient Details  ?Name: [name] ?MRN: [id number] ?Date of Birth: 01-04-1922 ? ? ?Medicare Important Message Given:  Yes ? ?Patient left prior to IM delivery will mail document to the patient home address. ? ? ? ?[name] ?11/09/2021, 2:36 PM ?

## 2021-11-09 NOTE — Progress Notes (Signed)
At change of shift report prior shift RN Rollene Fare and RN walked in room for handoff to find pt with agonal breathing, fixed gaze, and no response to voice, but responsive to painful stimuli. Elgergawy, MD called to bedside and ordered 500 ml bolus NS and IV cardizem. MD contact son with update. Pt starting speaking and following commands after orders administered. ? ? ?

## 2021-11-09 NOTE — Discharge Summary (Signed)
Physician Discharge Summary  ?DACIE WIND [id number] DOB: 13-Mar-1922 DOA: 11/01/2021 ? ?PCP: Alroy Dust, L.Marlou Sa, MD ? ?Admit date: 11/01/2021 ?Discharge date: 11/09/2021 ? ?Admitted From: Home ?Disposition: Home with Hospice ? ?Recommendations for Outpatient Follow-up:  ?Further management per hospice, patient will need close follow-up by hospice ? ? ?Discharge Condition: Patient is Hospice ?CODE STATUS:DNR/Hospice ?Diet recommendation: Regular ? ?Brief/Interim Summary: ? ?- 88 y.o. female with medical history significant of HTN presenting with a fall.  I spoke with her son.  Her daughter was there when she fell and called 911, cording to the family members she has had a few falls over the past several weeks which is unusual for her, she has also had a poor appetite and had 15 to 20 pounds of unintentional weight loss.  She was brought to the ER for this present fall where she was found to have A-fib with RVR, ED did not show any acute changes but there was a age indeterminant frontal lobe low-density area suspicious for infarct. ? ?Subacute CVA : ?-Work-up significant for subacute right frontal most likely embolic infarct, multiple small bilateral cerebral hemisphere infarcts.  ?- Likely due to underlying A-fib RVR, it was decided at the time of admission by the cardiology team and family that patient will not receive anticoagulation due to her advanced age and fall risk.  Discussed patient's clinical scenario in detail with family, they do not want heroics or full stroke work-up, they only want gentle medical treatment with medications, aspirin and statin will be continued for secondary prevention.  No further work-up will be done as management will not change.  Stroke most likely is embolic and she is not anticoagulation candidate.  Continue supportive care, long-term outcome appears poor family is open for hospice.  Palliative medicine consulted, family now wishes to take her home with hospice. ?  ?  ?New onset  A-fib RVR.  ?- [place] vas 2 score is greater than 3.  However poor candidate for anticoagulation due to multiple falls and advanced age as decided by cardiology, seen by cardiology, was on Cardizem drip now transition to oral Lopressor along with as needed IV Cardizem and IV Lopressor as needed.  Her Toprol-XL has been increased on discharge to 100 mg mg oral daily. ?-  echocardiogram noted with stable TSH. ?  ?Urinary retention with abdominal distention.   ?-CT abdomen pelvis nonacute.  Bladder scan did show more than 350 cc of urine, Foley and Flomax ,, had constipation with stool burden as well.  Improved after bowel regimen.  ?-Foley catheter discontinued 4/18, with no further evidence of retention. ?  ?Fever at the time of admission.  ?-  Of note daughter declined LP at the time of admission, CT abdomen pelvis nonacute, UA unremarkable, at risk for aspiration pneumonia.  For now place her on Unasyn and monitor. ?  ?Thoracic aneurysm.  4.8 cm, due to her age not a candidate for repair, beta-blocker and monitor.   ?  ?Dehydration with hypernatremia and hypokalemia.   ?-Resolved with hydration ?  ?Multiple falls, failure to thrive, unintentional weight loss and poor appetite and insomnia.  PT, OT and speech eval.  Remeron for stimulating appetite and help with insomnia.  Will qualify for SNF but family now wishes to take home with hospice. ?  ?Aortic regurgitation on echocardiogram.  Supportive care. ? ?Discharge Diagnoses:  ?Principal Problem: ?  Atrial fibrillation with RVR ([place]) ?Active Problems: ?  Fever ?  Abnormal head CT ?  Elevated troponin ?  Essential hypertension ?  Falls frequently ?  Pressure ulcer ?  Thoracic aortic aneurysm without rupture ([place]) ?  Hypernatremia ?  Hypokalemia ?  Dehydration ? ? ? ?Discharge Instructions ? ?Discharge Instructions   ? ? Amb referral to AFIB Clinic   Complete by: As directed ?  ? Diet - low sodium heart healthy   Complete by: As directed ?  ? Discharge instructions    Complete by: As directed ?  ? Follow with Primary MD Alroy Dust, L.Marlou Sa, MD in 7 days  ? ?Get CBC, CMP,  checked  by Primary MD next visit.  ? ? ?Activity: As tolerated with Full fall precautions use walker/cane & assistance as needed ? ? ?Disposition Home  ? ? ?Diet: Regular diet ? ? ?On your next visit with your primary care physician please Get Medicines reviewed and adjusted. ? ? ?Please request your Prim.MD to go over all Hospital Tests and Procedure/Radiological results at the follow up, please get all Hospital records sent to your Prim MD by signing hospital release before you go home. ? ? ?If you experience worsening of your admission symptoms, develop shortness of breath, life threatening emergency, suicidal or homicidal thoughts you must seek medical attention immediately by calling 911 or calling your MD immediately  if symptoms less severe. ? ?You Must read complete instructions/literature along with all the possible adverse reactions/side effects for all the Medicines you take and that have been prescribed to you. Take any new Medicines after you have completely understood and accpet all the possible adverse reactions/side effects.  ? ?Do not drive, operating heavy machinery, perform activities at heights, swimming or participation in water activities or provide baby sitting services if your were admitted for syncope or siezures until you have seen by Primary MD or a Neurologist and advised to do so again. ? ?Do not drive when taking Pain medications.  ? ? ?Do not take more than prescribed Pain, Sleep and Anxiety Medications ? ?Special Instructions: If you have smoked or chewed Tobacco  in the last 2 yrs please stop smoking, stop any regular Alcohol  and or any Recreational drug use. ? ?Wear Seat belts while driving. ? ? ?Please note ? ?You were cared for by a hospitalist during your hospital stay. If you have any questions about your discharge medications or the care you received while you were in the  hospital after you are discharged, you can call the unit and asked to speak with the hospitalist on call if the hospitalist that took care of you is not available. Once you are discharged, your primary care physician will handle any further medical issues. Please note that NO REFILLS for any discharge medications will be authorized once you are discharged, as it is imperative that you return to your primary care physician (or establish a relationship with a primary care physician if you do not have one) for your aftercare needs so that they can reassess your need for medications and monitor your lab values.  ? Increase activity slowly   Complete by: As directed ?  ? No wound care   Complete by: As directed ?  ? ?  ? ?Allergies as of 11/09/2021   ? ?   Reactions  ? Codeine Rash  ? ?  ? ?  ?Medication List  ?  ? ?STOP taking these medications   ? ?zolpidem 5 MG tablet ?Commonly known as: AMBIEN ?  ? ?  ? ?TAKE these medications   ? ?acetaminophen 325 MG  tablet ?Commonly known as: TYLENOL ?Take 2 tablets (650 mg total) by mouth every 6 (six) hours as needed for mild pain (or Fever >/= 101). ?  ?aspirin 81 MG chewable tablet ?Chew 1 tablet (81 mg total) by mouth daily. ?  ?metoprolol succinate 50 MG 24 hr tablet ?Commonly known as: TOPROL-XL ?Take 2 tablets (100 mg total) by mouth daily. Take with or immediately following a meal. ?What changed: how much to take ?  ?mirtazapine 15 MG disintegrating tablet ?Commonly known as: REMERON SOL-TAB ?Take 1 tablet (15 mg total) by mouth at bedtime. ?  ? ?  ? ? ?Allergies  ?Allergen Reactions  ? Codeine Rash  ? ? ?Consultations: ?[facility] ?cardiology ? ? ?Procedures/Studies: ?DG Elbow Complete Left ? ?Result Date: 11/01/2021 ?CLINICAL DATA:  Unwitnessed fall. EXAM: LEFT ELBOW - COMPLETE 3+ VIEW COMPARISON:  None. FINDINGS: There is no evidence of fracture, dislocation, or joint effusion. Minimal degenerative change. Soft tissues are unremarkable. IMPRESSION: No fracture or  subluxation of the left elbow. Electronically Signed   By: Keith Rake M.D.   On: 11/01/2021 17:30  ? ?DG Abd 1 View ? ?Result Date: 11/05/2021 ?CLINICAL DATA:  88 year old female with abdominal pain and dis

## 2021-11-09 NOTE — Progress Notes (Incomplete)
At change of shift both RN walked into the room for hand off.  Patient was found breathing hard with eyes gazing upward,fixed and no blinking as though having seizures.  Patient was not responding to command.  She only groan in response to sternal rub. Vital signs obtain.  BP 78/50  Dr. Cornell Barman notified and at bedside.  An order for cardizem and 500 normal saline bolus received MD called son with update. Shortly after, patient started talking and follow commands.   ?

## 2021-11-09 NOTE — TOC Transition Note (Signed)
Transition of Care (TOC) - CM/SW Discharge Note ? ? ?Patient Details  ?Name: [name] ?MRN: [id number] ?Date of Birth: [birth year] ? ?Transition of Care (TOC) CM/SW Contact:  ?Lockie Pares, RN ?Phone Number: ?11/09/2021, 11:23 AM ? ? ?Clinical Narrative:    ? ? Spoke to [employer] regarding DME at the home, they have confirmed that the equipment is there. Daughter asking for more equipment, however they stated that they would assess that on first visit.  ?Patient is discharging today.  Called daughter [name], [phone] left confidential message regarding to call CM back.  Called  [name]  She has been picked up and is heading home at this time.  ? ?Final next level of care: Home w Hospice Care ?Barriers to Discharge: Family Issues ? ? ?Patient Goals and CMS Choice ?Patient states their goals for this hospitalization and ongoing recovery are:: Comfort ?CMS Medicare.gov Compare Post Acute Care list provided to:: Patient Represenative (must comment) ?Choice offered to / list presented to : Adult Children ? ?Discharge Placement ?  ?           ?  ?  ?  ?  ? ?Discharge Plan and Services ?In-house Referral: Clinical Social Work ?  ?Post Acute Care Choice: Hospice          ?DME Arranged:  (DME through [employer]) ?  ?  ?  ?  ?  ?  ?  ?  ?  ? ?Social Determinants of Health (SDOH) Interventions ?  ? ? ?Readmission Risk Interventions ?   ? View : No data to display.  ?  ?  ?  ? ? ? ? ? ?

## 2021-11-09 NOTE — Discharge Instructions (Signed)
Follow with Primary MD Clovis Riley, L.August Saucer, MD in 7 days  ? ?Get CBC, CMP,  checked  by Primary MD next visit.  ? ? ?Activity: As tolerated with Full fall precautions use walker/cane & assistance as needed ? ? ?Disposition Home  ? ? ?Diet: Regular diet ? ? ?On your next visit with your primary care physician please Get Medicines reviewed and adjusted. ? ? ?Please request your Prim.MD to go over all Hospital Tests and Procedure/Radiological results at the follow up, please get all Hospital records sent to your Prim MD by signing hospital release before you go home. ? ? ?If you experience worsening of your admission symptoms, develop shortness of breath, life threatening emergency, suicidal or homicidal thoughts you must seek medical attention immediately by calling 911 or calling your MD immediately  if symptoms less severe. ? ?You Must read complete instructions/literature along with all the possible adverse reactions/side effects for all the Medicines you take and that have been prescribed to you. Take any new Medicines after you have completely understood and accpet all the possible adverse reactions/side effects.  ? ?Do not drive, operating heavy machinery, perform activities at heights, swimming or participation in water activities or provide baby sitting services if your were admitted for syncope or siezures until you have seen by Primary MD or a Neurologist and advised to do so again. ? ?Do not drive when taking Pain medications.  ? ? ?Do not take more than prescribed Pain, Sleep and Anxiety Medications ? ?Special Instructions: If you have smoked or chewed Tobacco  in the last 2 yrs please stop smoking, stop any regular Alcohol  and or any Recreational drug use. ? ?Wear Seat belts while driving. ? ? ?Please note ? ?You were cared for by a hospitalist during your hospital stay. If you have any questions about your discharge medications or the care you received while you were in the hospital after you are  discharged, you can call the unit and asked to speak with the hospitalist on call if the hospitalist that took care of you is not available. Once you are discharged, your primary care physician will handle any further medical issues. Please note that NO REFILLS for any discharge medications will be authorized once you are discharged, as it is imperative that you return to your primary care physician (or establish a relationship with a primary care physician if you do not have one) for your aftercare needs so that they can reassess your need for medications and monitor your lab values.  ?

## 2021-11-19 DIAGNOSIS — E878 Other disorders of electrolyte and fluid balance, not elsewhere classified: Secondary | ICD-10-CM | POA: Diagnosis not present

## 2021-11-19 DIAGNOSIS — I4891 Unspecified atrial fibrillation: Secondary | ICD-10-CM | POA: Diagnosis not present

## 2021-11-19 DIAGNOSIS — H9190 Unspecified hearing loss, unspecified ear: Secondary | ICD-10-CM | POA: Diagnosis not present

## 2021-11-19 DIAGNOSIS — I712 Thoracic aortic aneurysm, without rupture, unspecified: Secondary | ICD-10-CM | POA: Diagnosis not present

## 2021-11-19 DIAGNOSIS — I69318 Other symptoms and signs involving cognitive functions following cerebral infarction: Secondary | ICD-10-CM | POA: Diagnosis not present

## 2021-12-20 DEATH — deceased

## 2023-01-21 IMAGING — CR DG ABDOMEN 1V
1 series · 1 of 1 positions shown · non-contrast
Comparison: 11/03/2021, CT 11/01/2021

CLINICAL DATA: [AGE] female with abdominal pain and
distension

EXAM:
ABDOMEN - 1 VIEW

[abdomen kub]
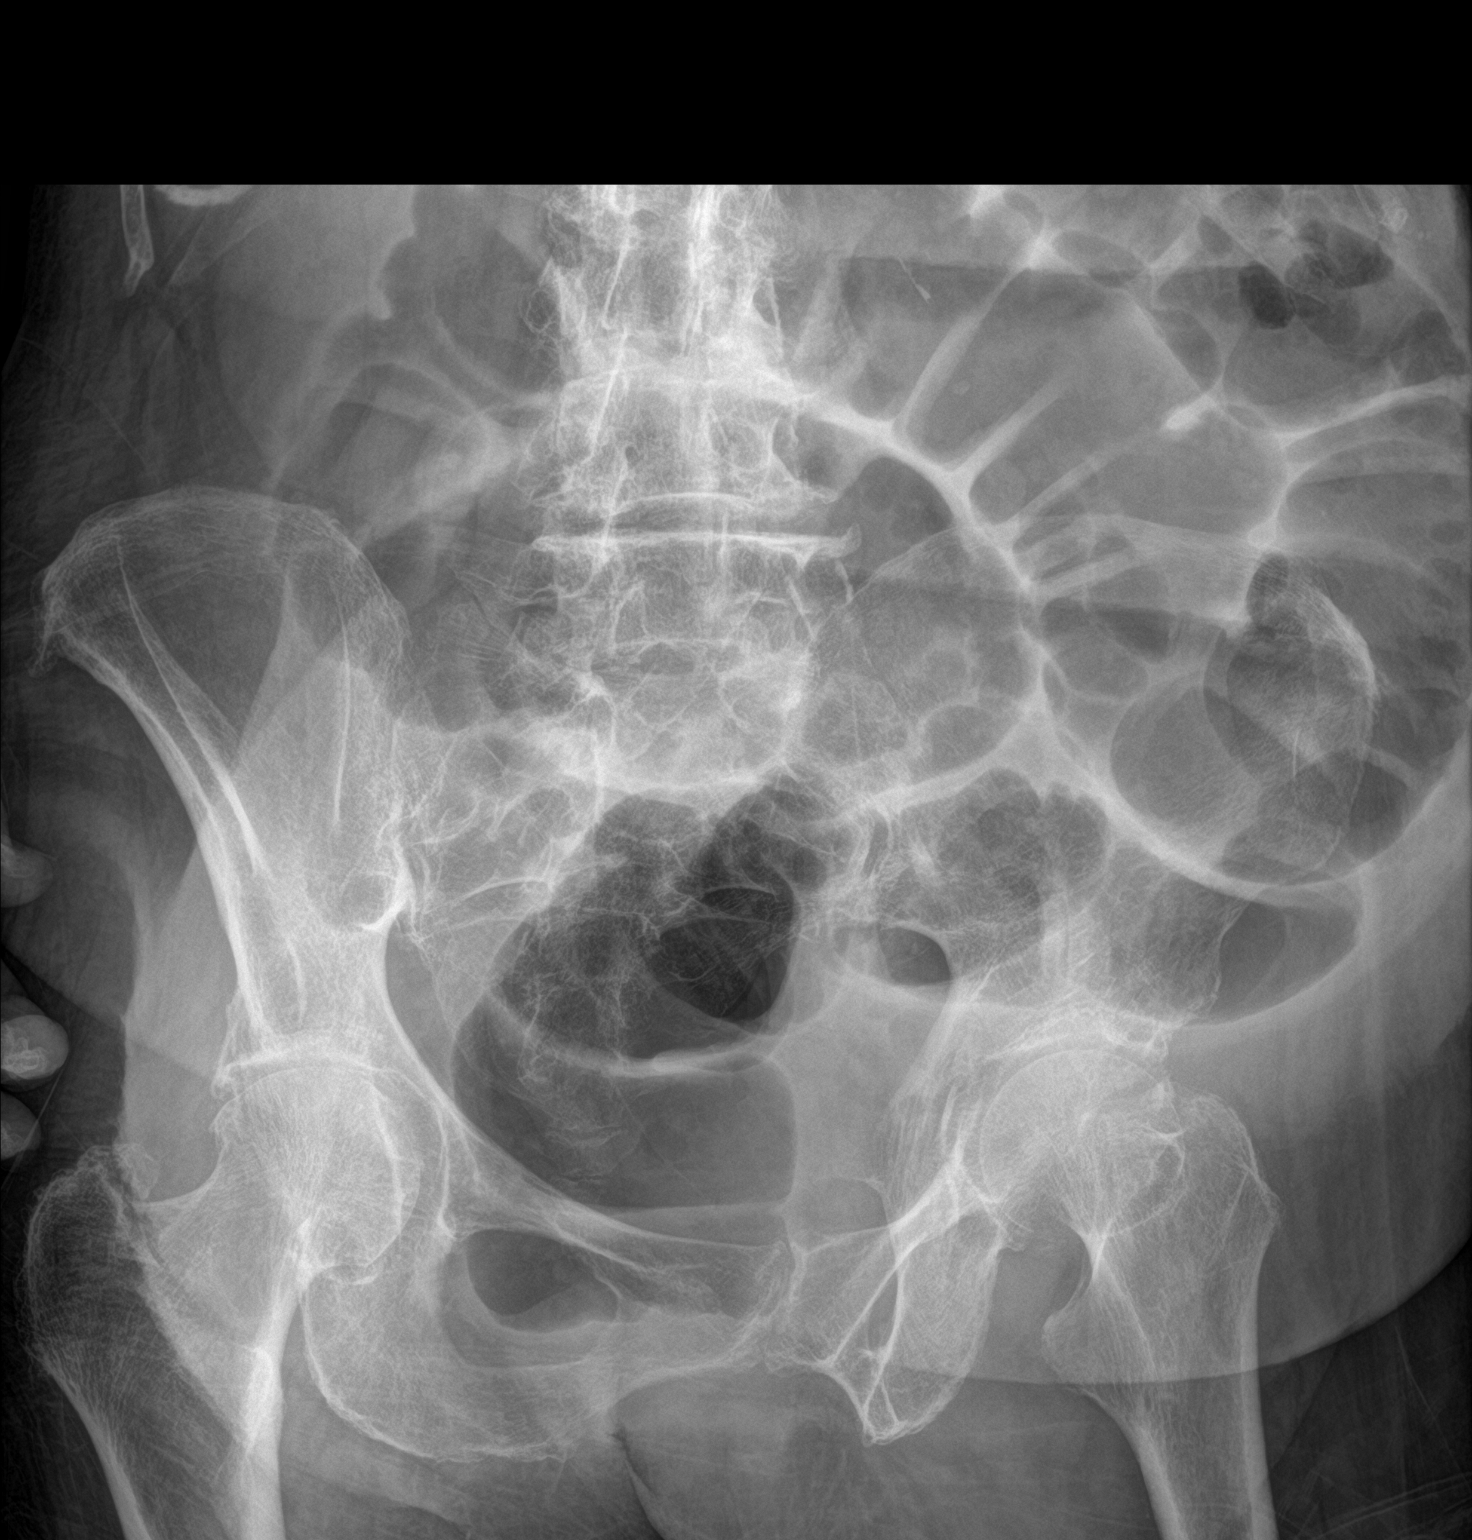

[1 of 1 positions shown; findings below may reference images not displayed]

FINDINGS: Unchanged appearance of diffuse gaseous distension of the colon,
with gas extending to the rectum. No significant small bowel
distension on the plain film.

Osteopenia.
IMPRESSION: Unchanged appearance of diffuse colonic gaseous distension.
